# Patient Record
Sex: Male | Born: 1980 | Race: White | Hispanic: Yes | Marital: Single
Health system: Southern US, Community
[De-identification: ages and names within clinical notes are randomized; demographics above are authoritative.]

---

## 2008-10-12 ENCOUNTER — Emergency Department (HOSPITAL_COMMUNITY): Admission: EM | Admit: 2008-10-12 | Discharge: 2008-10-12 | Payer: Self-pay | Admitting: Emergency Medicine

## 2008-10-12 IMAGING — CR DG CERVICAL SPINE COMPLETE 4+V
5 series · 5 of 5 positions shown · non-contrast
Comparison: None

CLINICAL DATA: The the fall.  Neck pain.

CERVICAL SPINE - COMPLETE 4+ VIEW

[w c-spine lat]
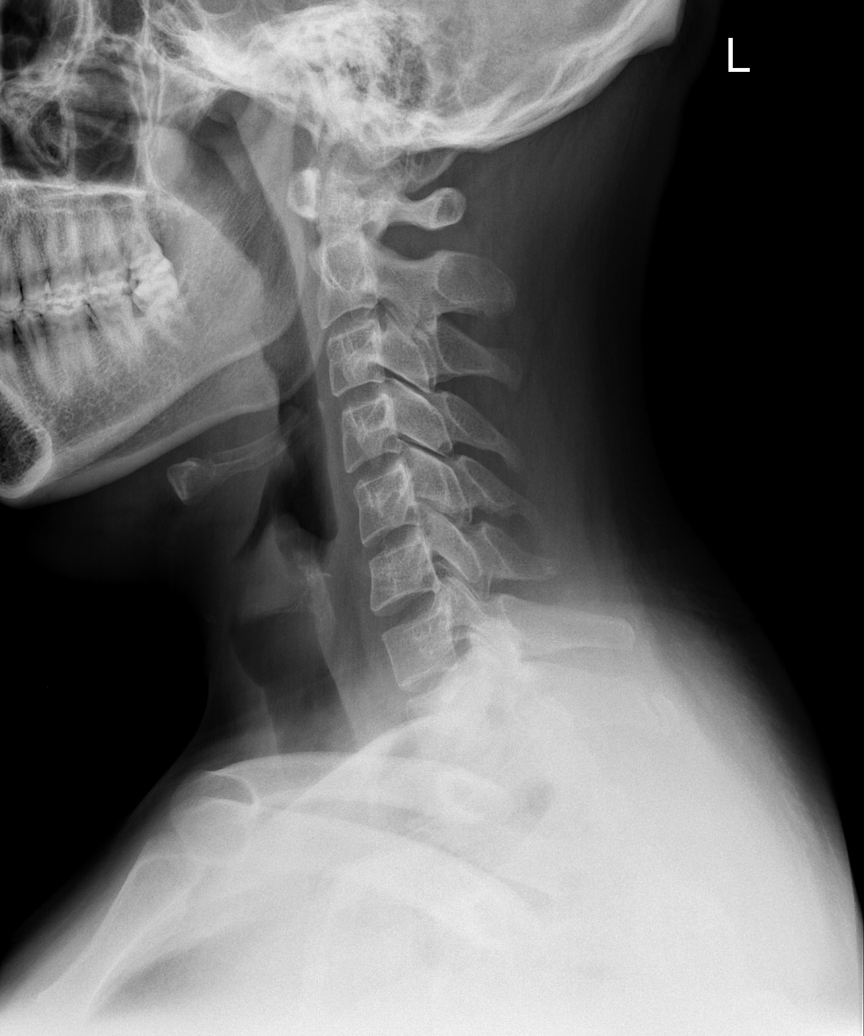

[w c-spine oblique (1 of 2)]
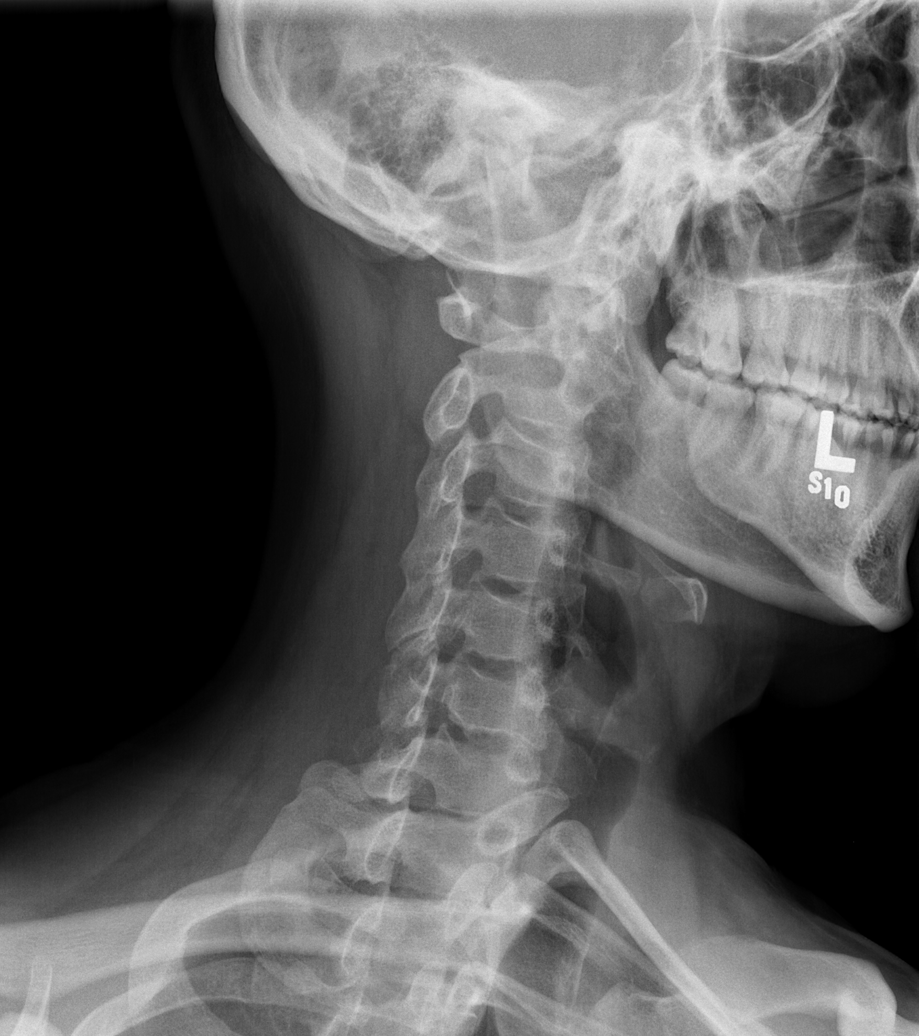

[w c-spine oblique (2 of 2)]
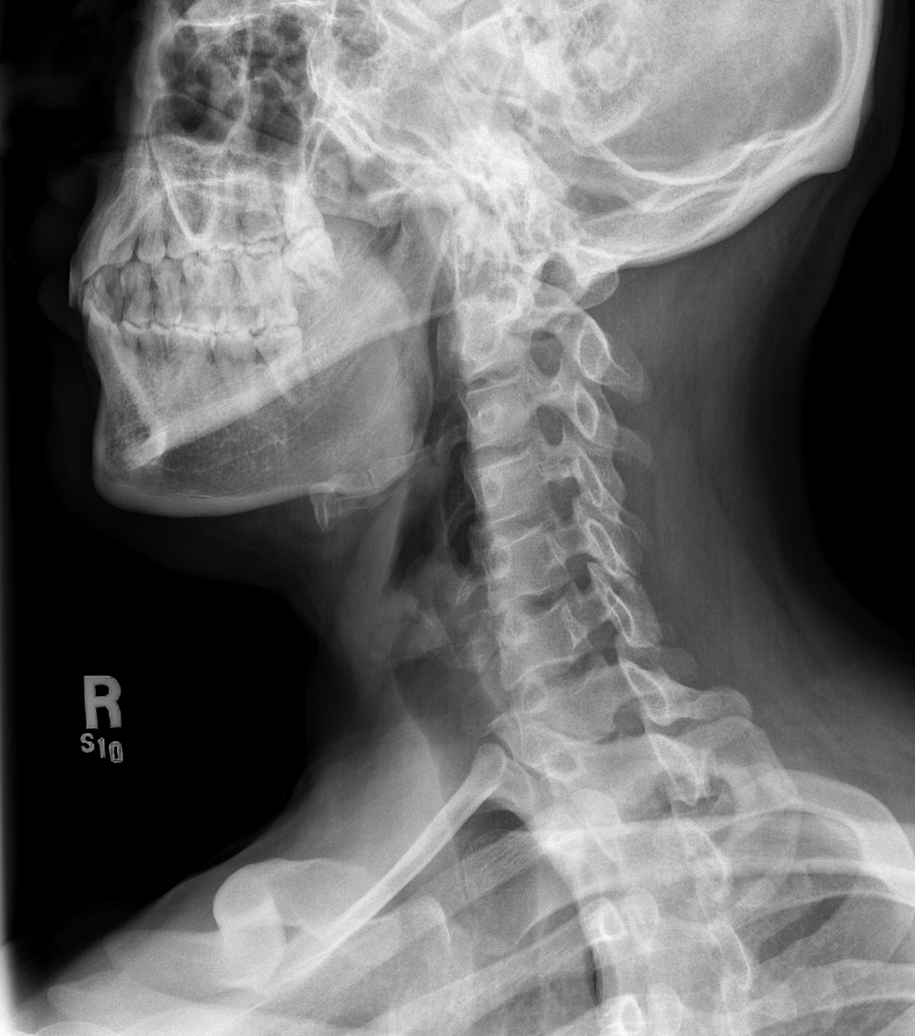

[w c-spine a.p.]
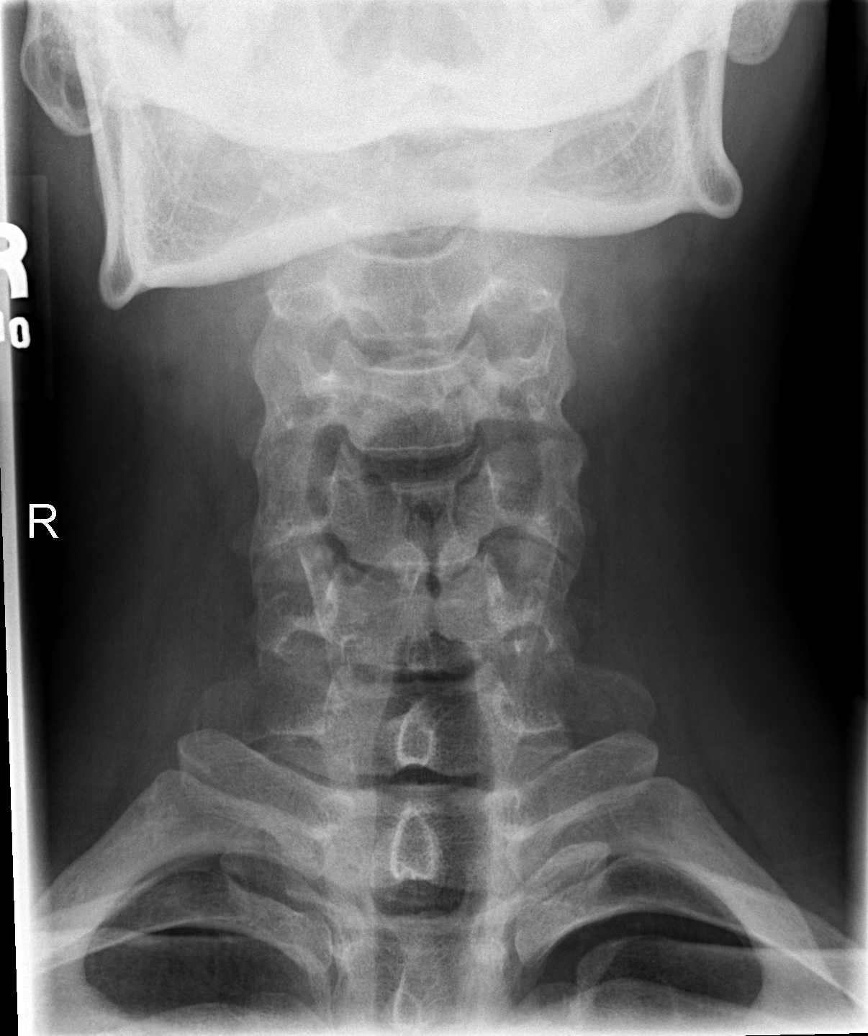

[w c-spine odontoid]
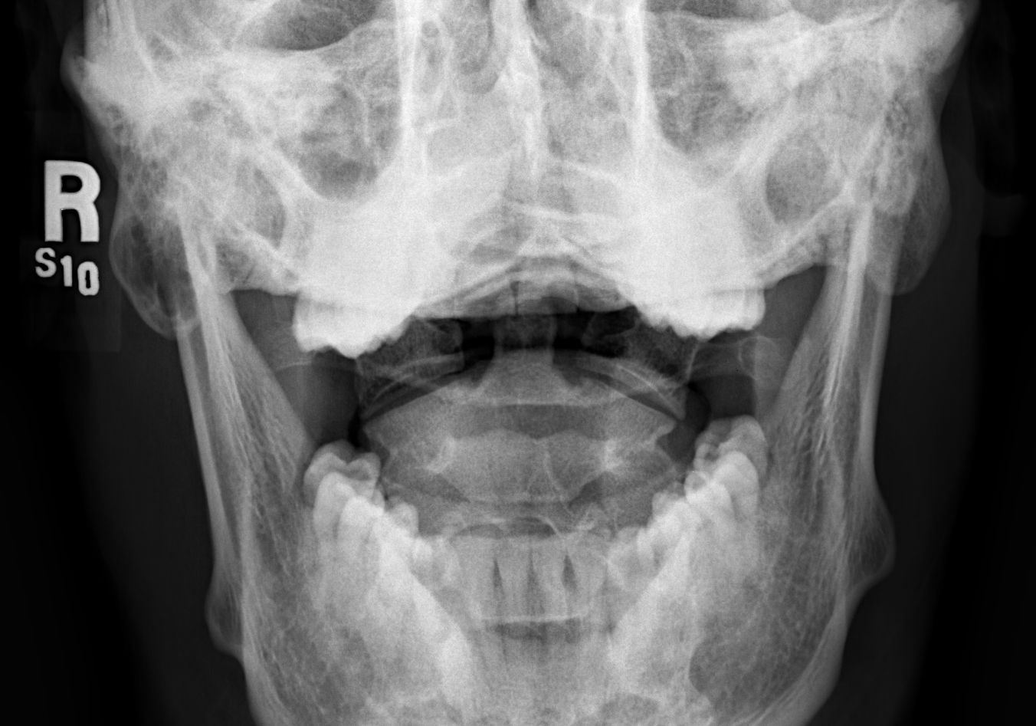

[5 of 5 positions shown; findings below may reference images not displayed]

FINDINGS: Alignment is normal.  No sign of fracture, subluxation,
soft tissue swelling or degenerative change.
IMPRESSION: Normal radiographs

## 2008-10-12 IMAGING — CR DG LUMBAR SPINE COMPLETE 4+V
5 series · 5 of 5 positions shown · non-contrast
Comparison: None

CLINICAL DATA: Fall.  Lower lumbar pain.

LUMBAR SPINE - COMPLETE 4+ VIEW

[t l-spine a.p.]
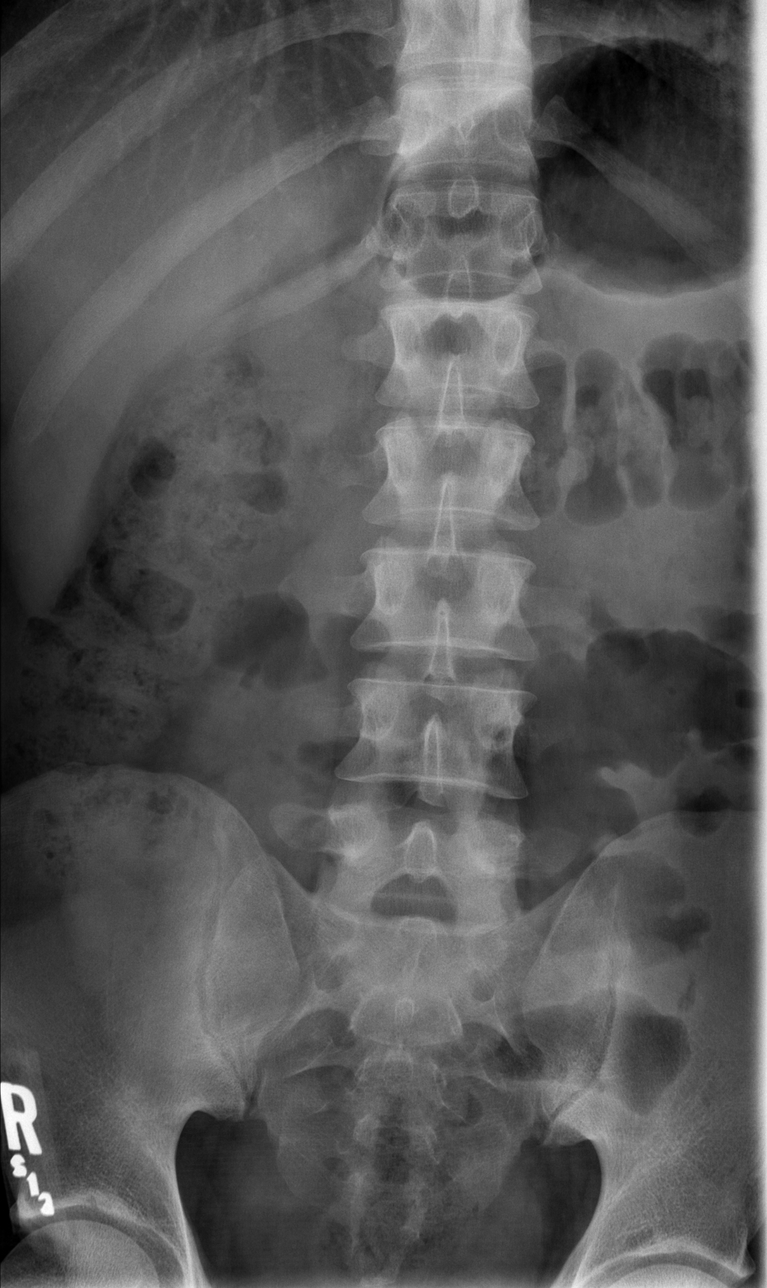

[t l-spine oblique exposure (1 of 2)]
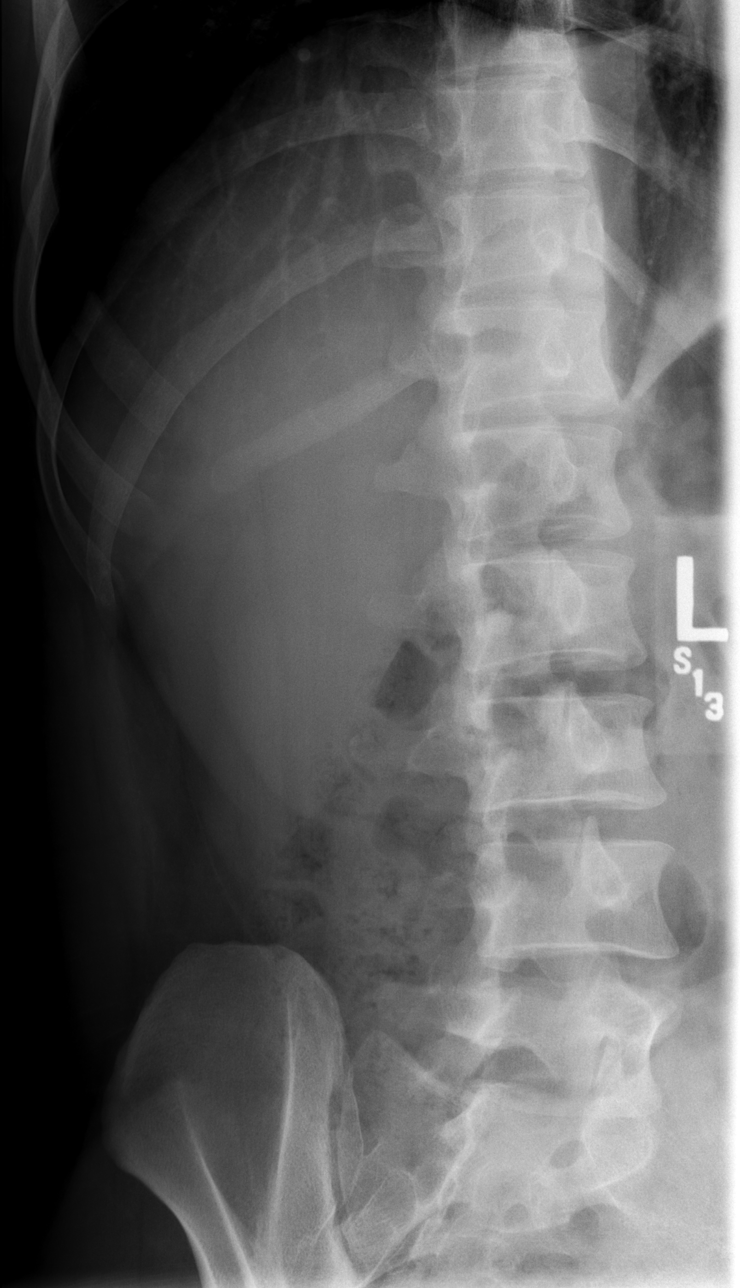

[t l-spine oblique exposure (2 of 2)]
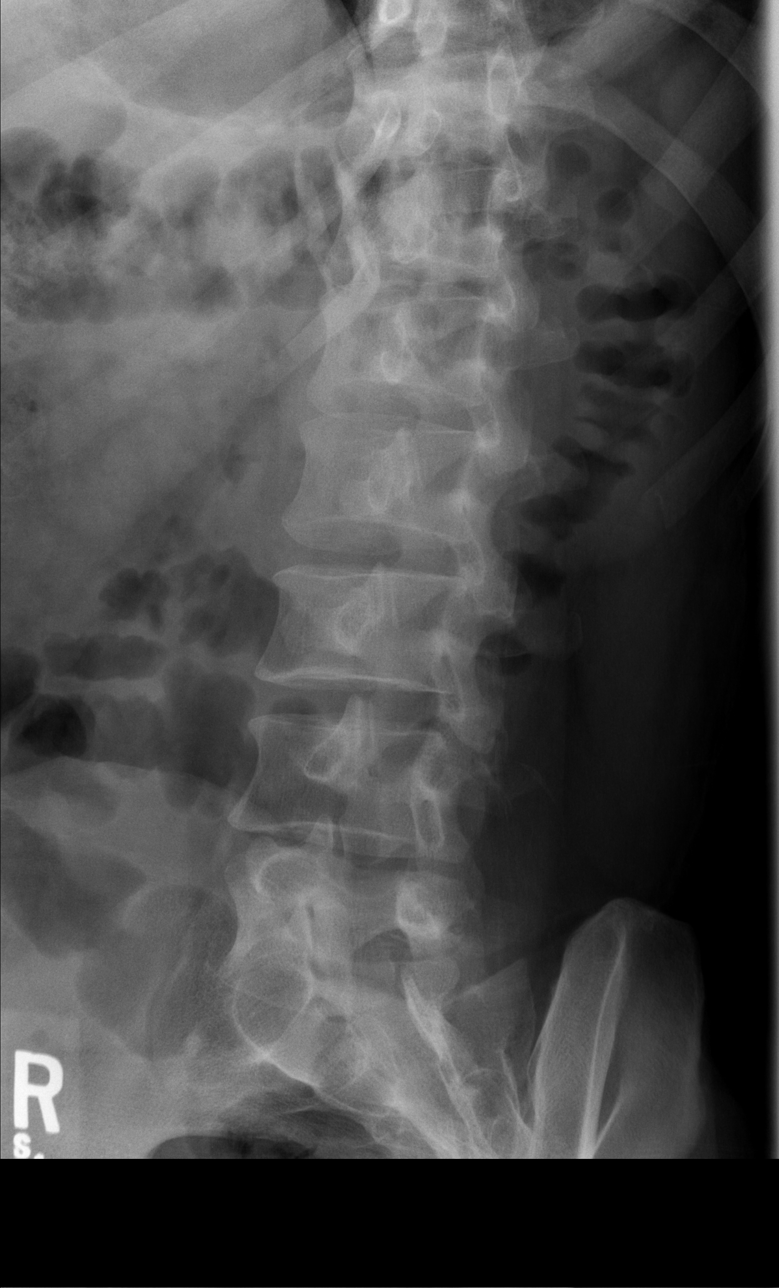

[t l-spine lat]
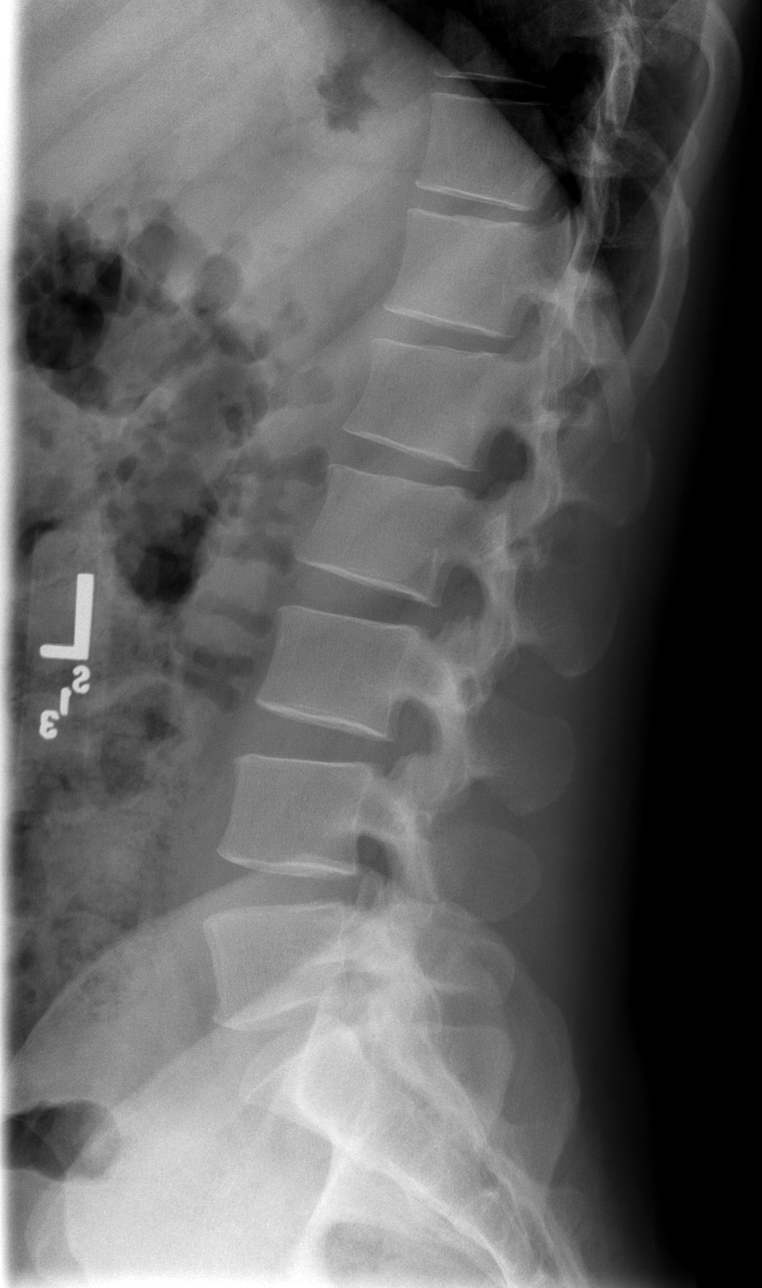

[t l-spine l5-s1 spot]
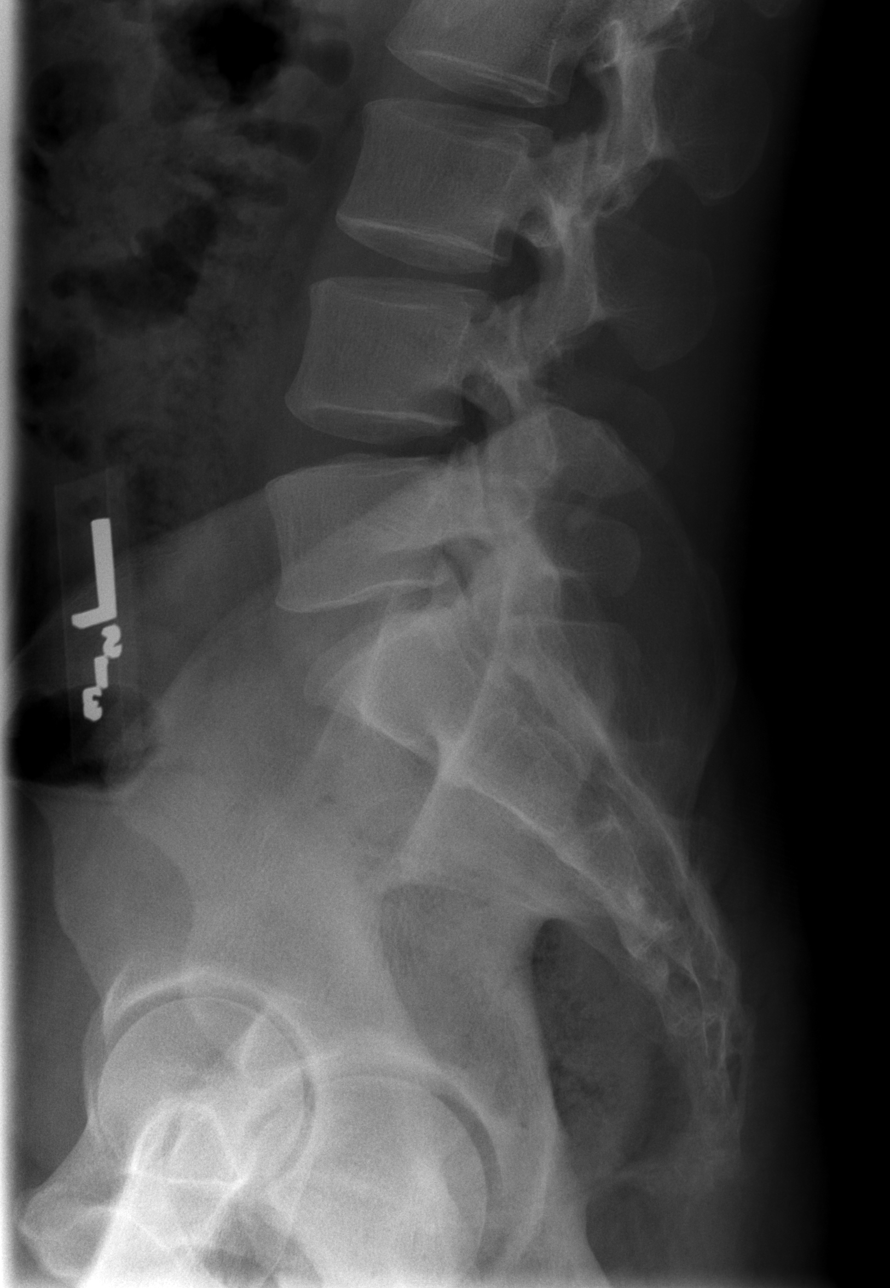

[5 of 5 positions shown; findings below may reference images not displayed]

FINDINGS: Five lumbar-type vertebral bodies show normal alignment.
Disc space heights are normal.  No degenerative changes seen.  No
evidence of fracture or other focal lesion.
IMPRESSION: Negative radiographs

## 2008-10-12 IMAGING — CR DG THORACIC SPINE 2V
3 series · 3 of 3 positions shown · non-contrast
Comparison: None

CLINICAL DATA: Fall.  Back pain, particularly T6-7 region.

THORACIC SPINE - 2 VIEW

[t t-spine a.p.]
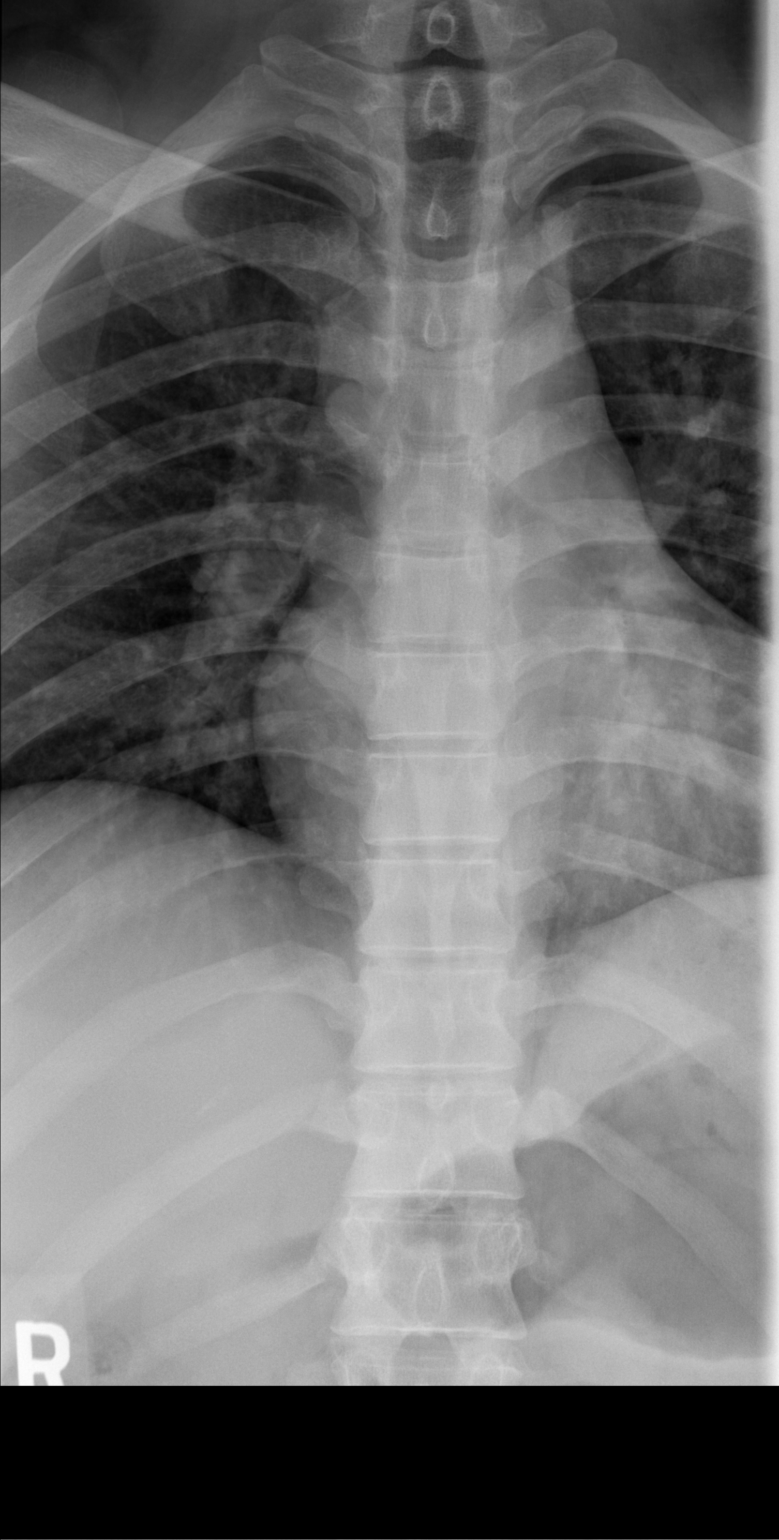

[t t-spine lat *]
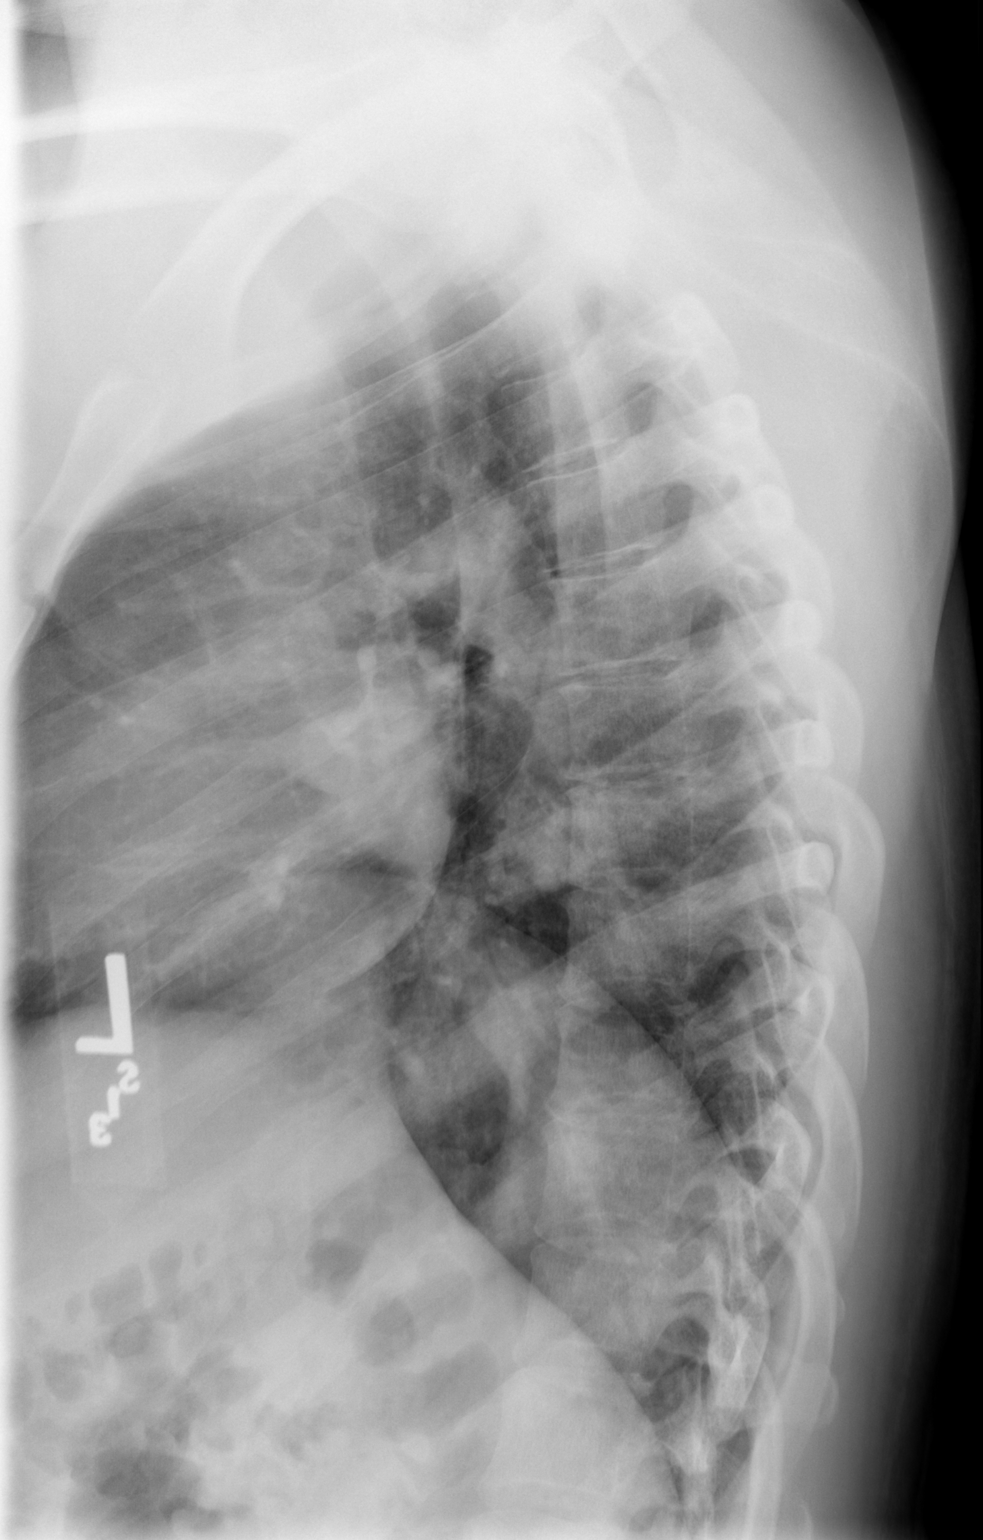

[t swimmers *]
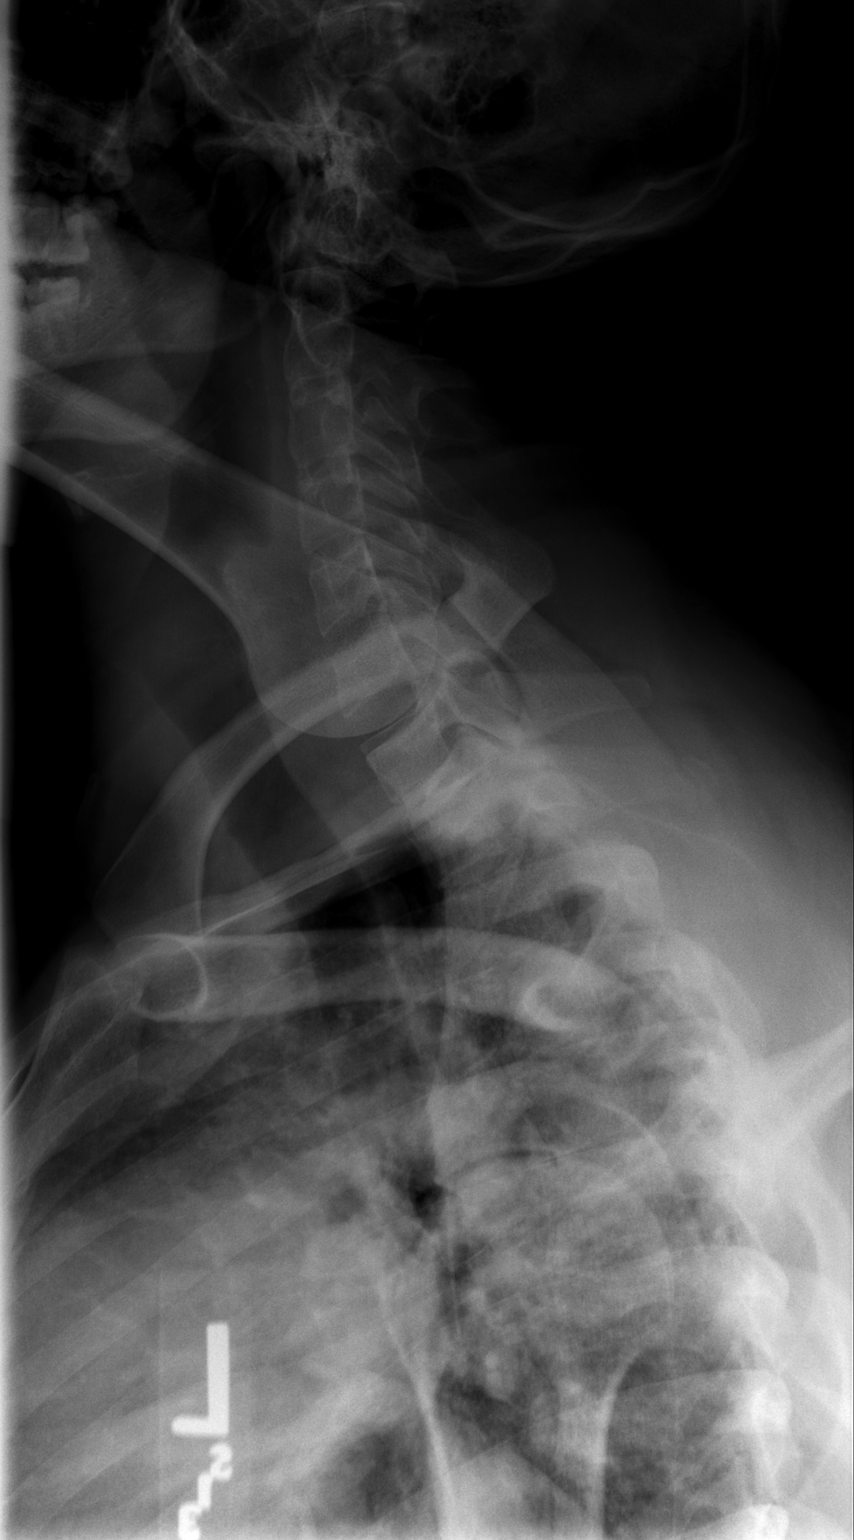

[3 of 3 positions shown; findings below may reference images not displayed]

FINDINGS: Alignment is normal.  No definable fracture.  No
paravertebral swelling.  Posteromedial ribs appear unremarkable.
IMPRESSION: Negative

## 2009-05-17 ENCOUNTER — Emergency Department (HOSPITAL_COMMUNITY): Admission: EM | Admit: 2009-05-17 | Discharge: 2009-05-17 | Payer: Self-pay | Admitting: Emergency Medicine

## 2009-05-17 IMAGING — CR DG LUMBAR SPINE COMPLETE 4+V
3 series · 3 of 3 positions shown · non-contrast
Comparison: 10/12/2008

CLINICAL DATA: Low back pain

LUMBAR SPINE - COMPLETE 4+ VIEW

[t l-spine a.p.]
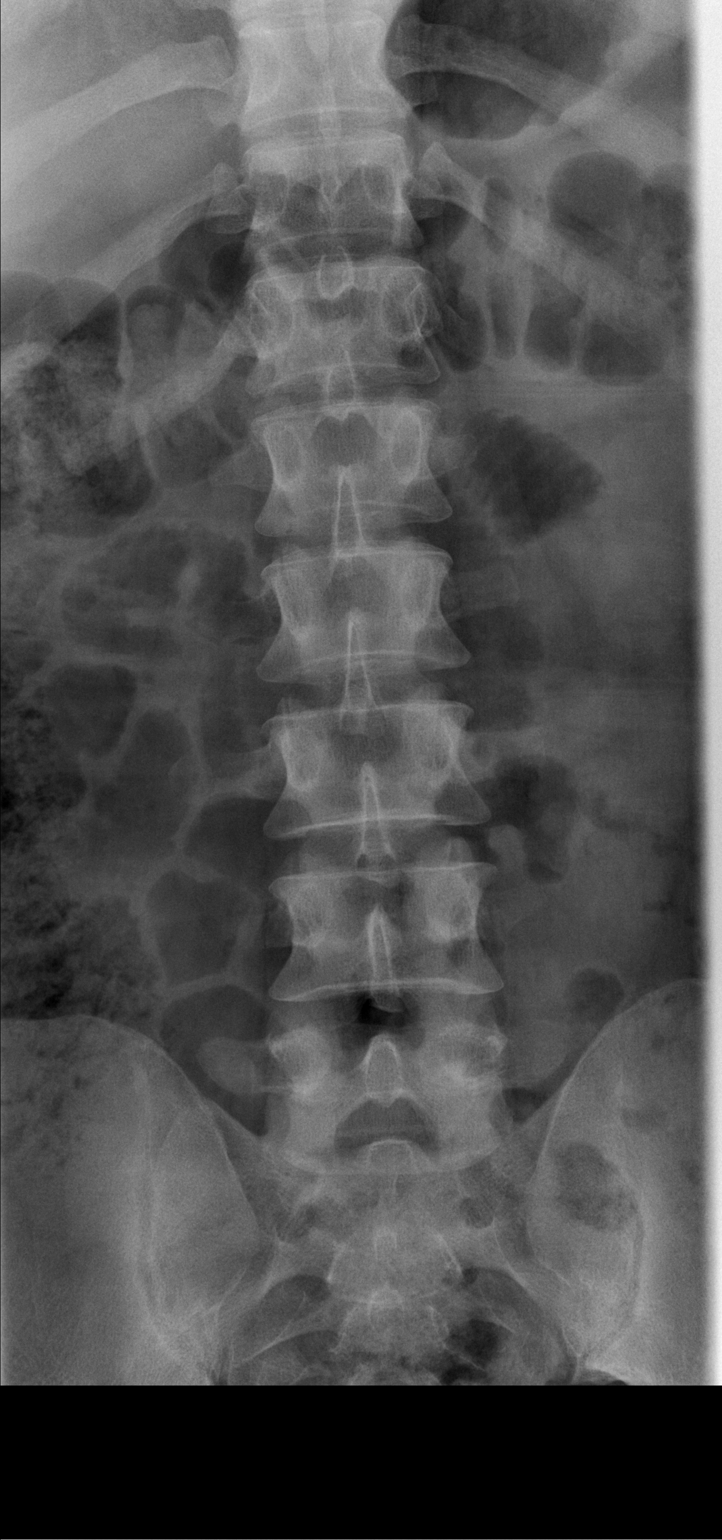

[t l-spine oblique exposure (1 of 2)]
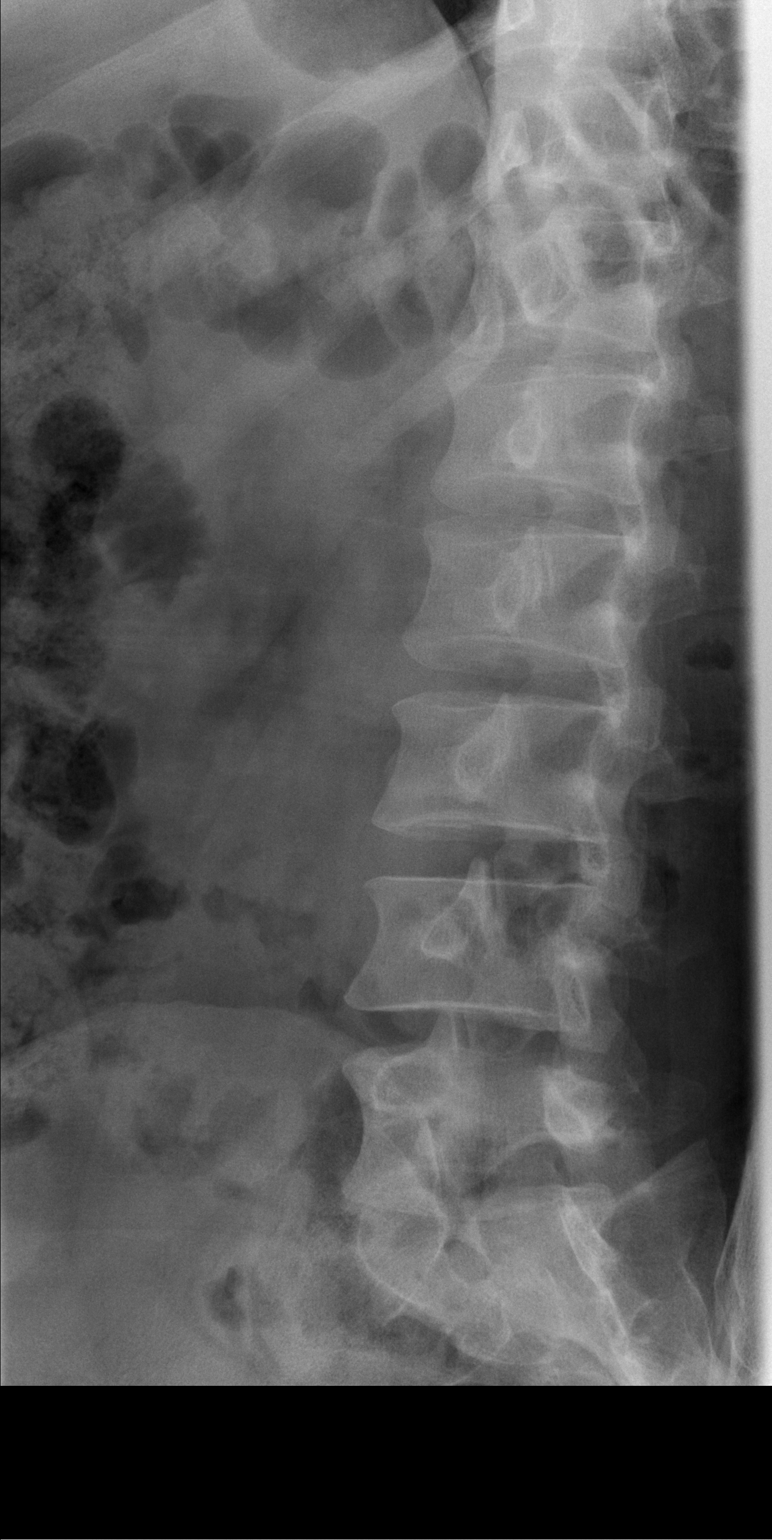

[t l-spine oblique exposure (2 of 2)]
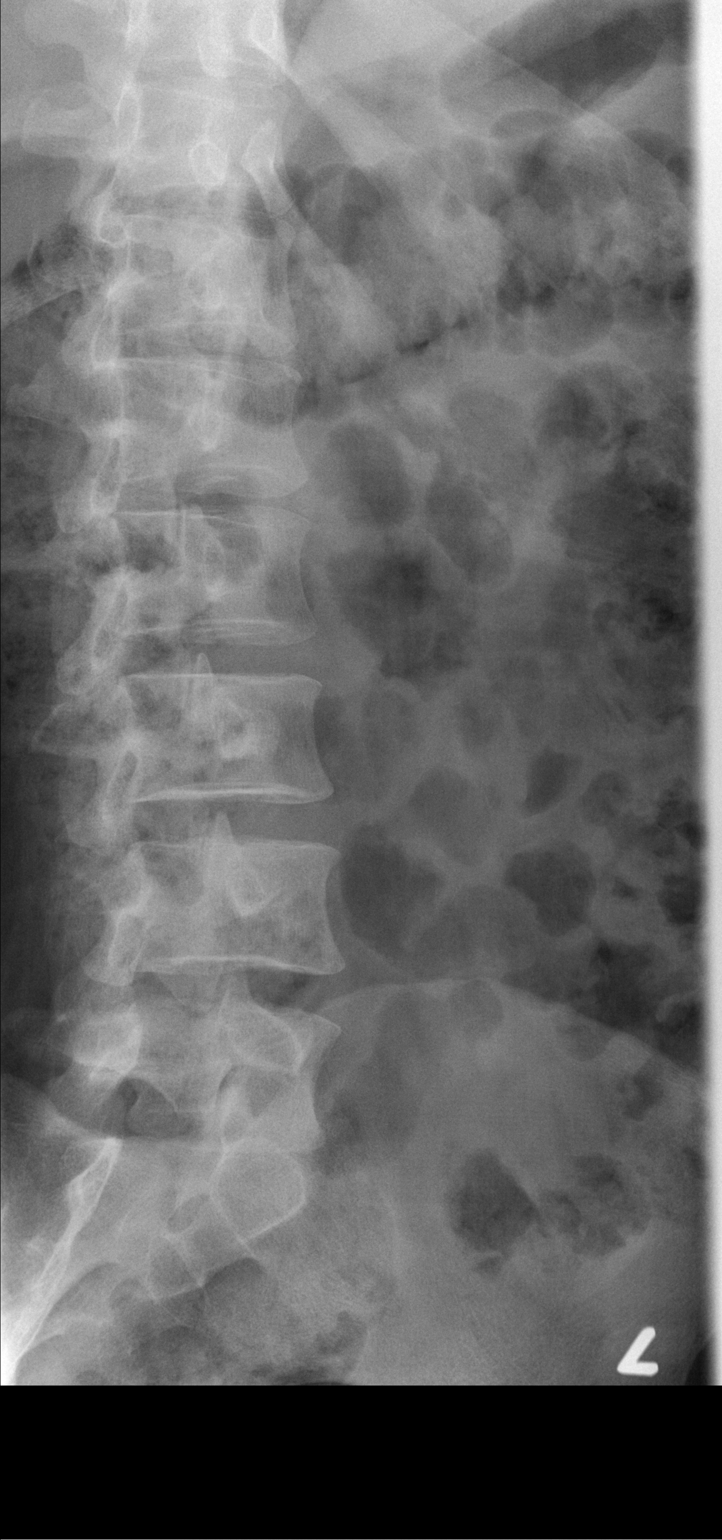

[3 of 3 positions shown; findings below may reference images not displayed]

FINDINGS: No fracture.  No subluxation.  Bony alignment is
anatomic.  The intervertebral disc spaces are preserved throughout.
Facets are well-aligned bilaterally.  SI joints are normal.
IMPRESSION: Normal exam.

## 2009-05-17 IMAGING — CR DG LUMBAR SPINE COMPLETE 4+V
5 series · 5 of 5 positions shown · non-contrast
Comparison: 10/12/2008

CLINICAL DATA: Low back pain

LUMBAR SPINE - COMPLETE 4+ VIEW

[t l-spine a.p.]
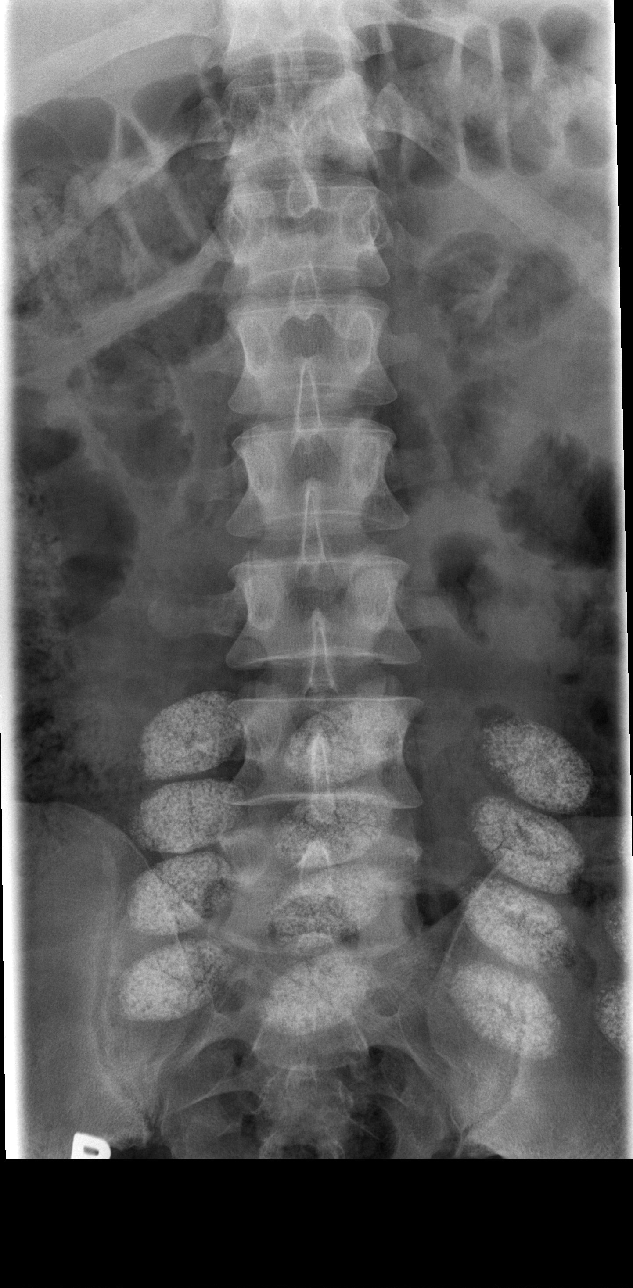

[t l-spine oblique exposure (1 of 2)]
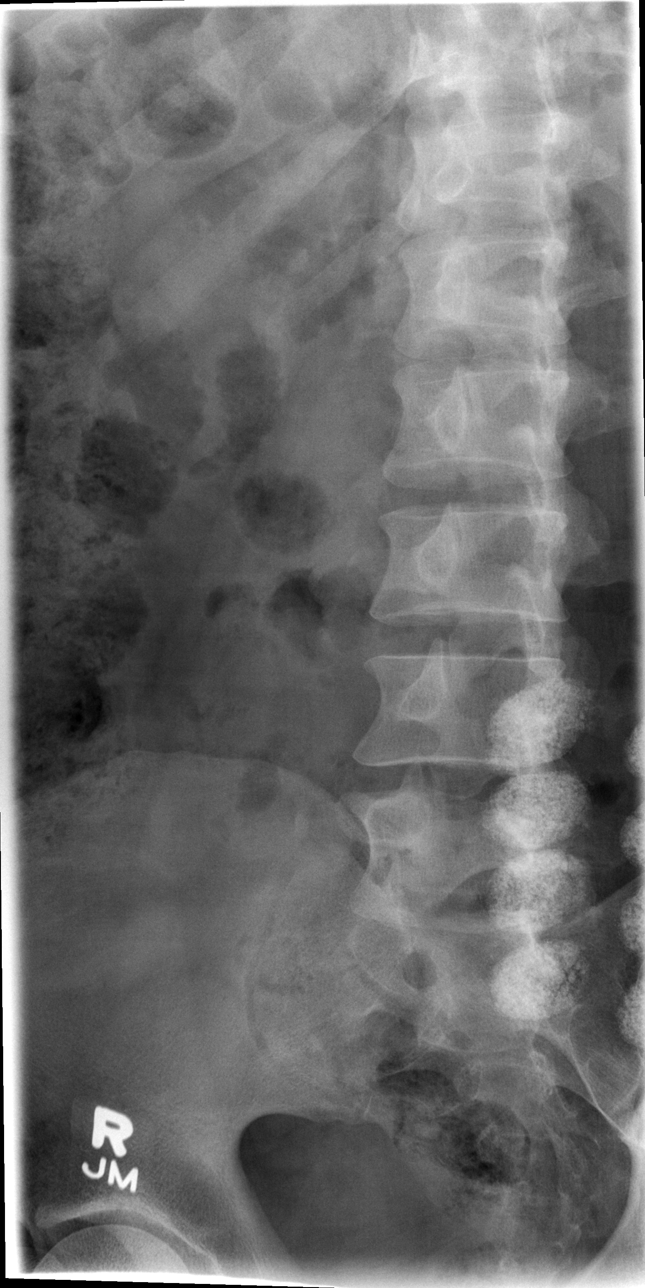

[t l-spine oblique exposure (2 of 2)]
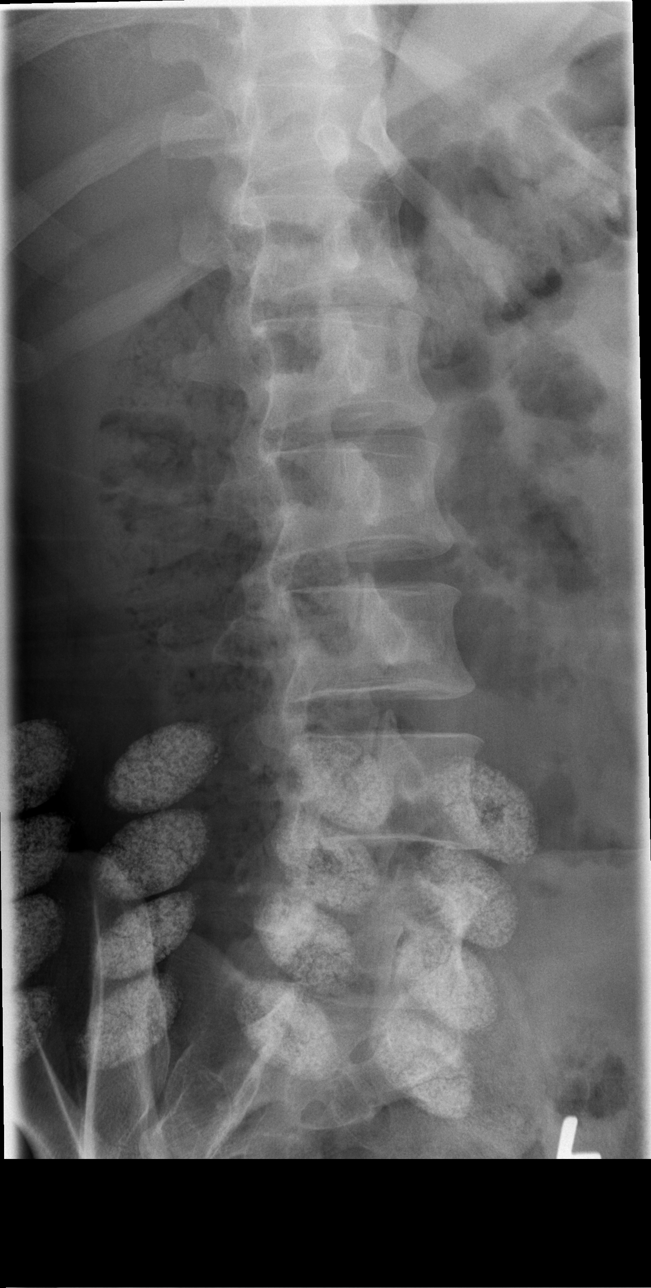

[t l-spine lat]
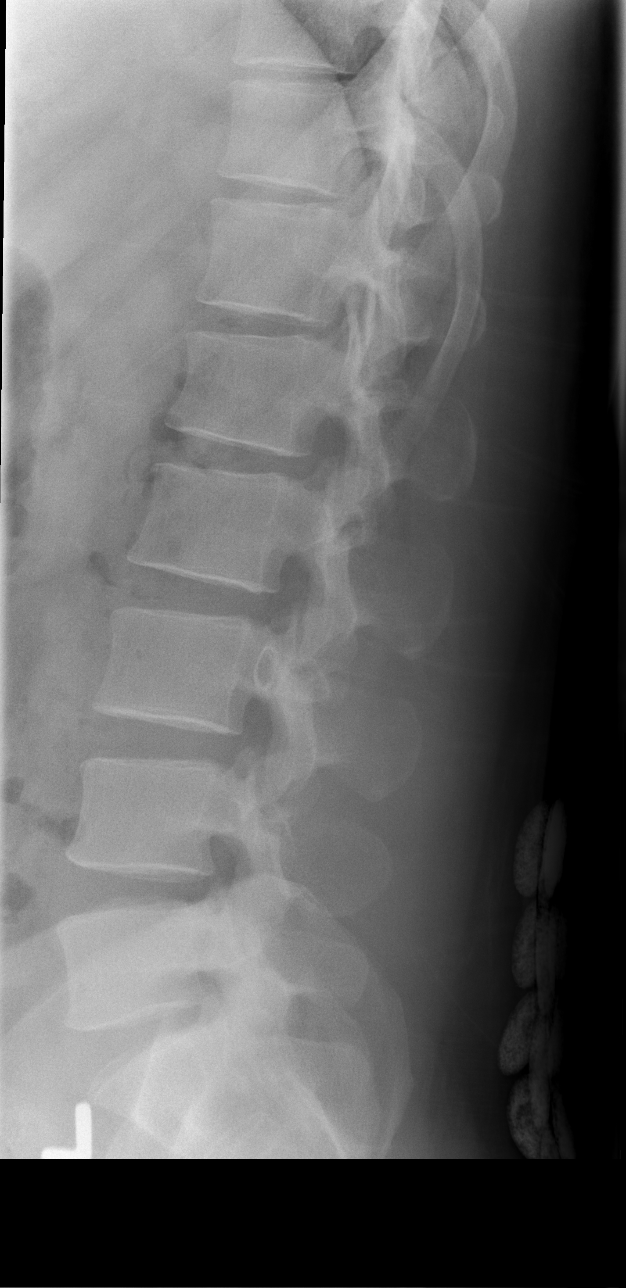

[t l-spine l5-s1 spot]
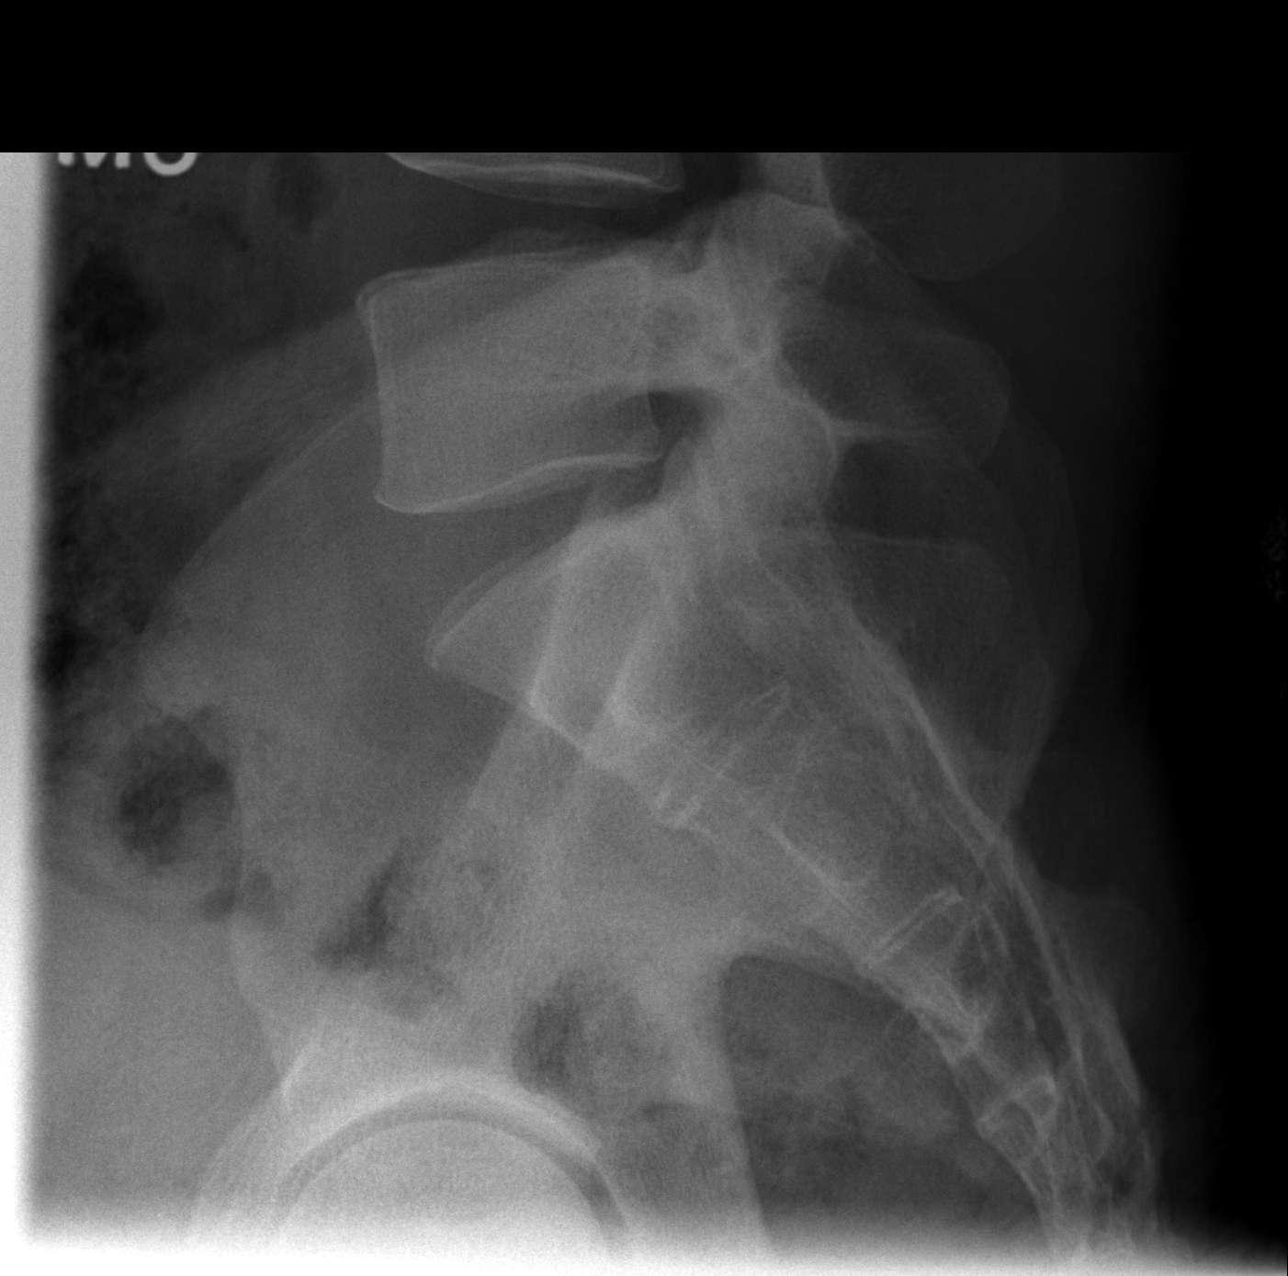

[5 of 5 positions shown; findings below may reference images not displayed]

FINDINGS: No fracture.  No subluxation.  Bony alignment is
anatomic.  The intervertebral disc spaces are preserved throughout.
Facets are well-aligned bilaterally.  SI joints are normal.
IMPRESSION: Normal exam.

## 2009-12-11 ENCOUNTER — Emergency Department (HOSPITAL_COMMUNITY): Admission: EM | Admit: 2009-12-11 | Discharge: 2009-12-12 | Payer: Self-pay | Admitting: Emergency Medicine

## 2009-12-12 IMAGING — CR DG LUMBAR SPINE COMPLETE 4+V
5 series · 5 of 5 positions shown · non-contrast
Comparison: 05/17/2009

CLINICAL DATA: Low back pain radiating to right leg

LUMBAR SPINE - COMPLETE 4+ VIEW

[t l-spine a.p.]
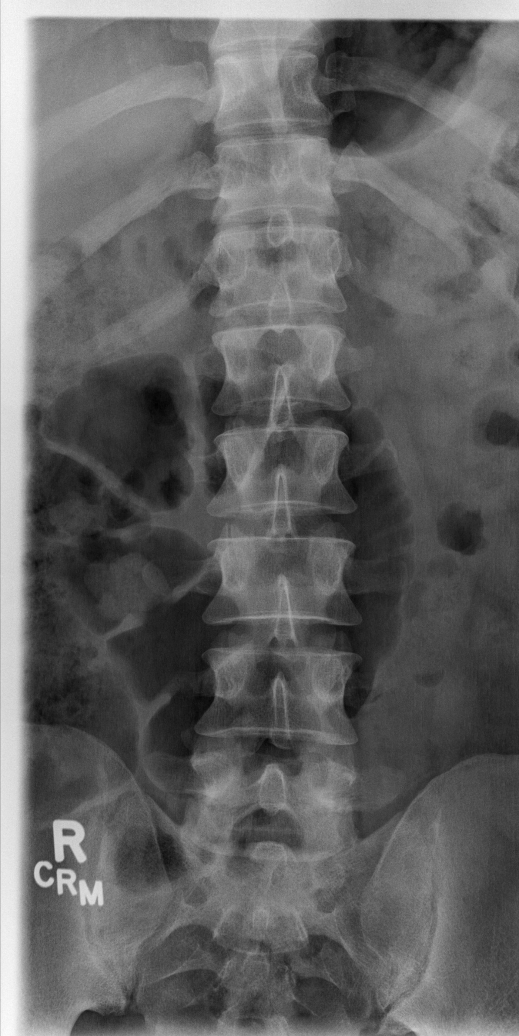

[t l-spine oblique exposure (1 of 2)]
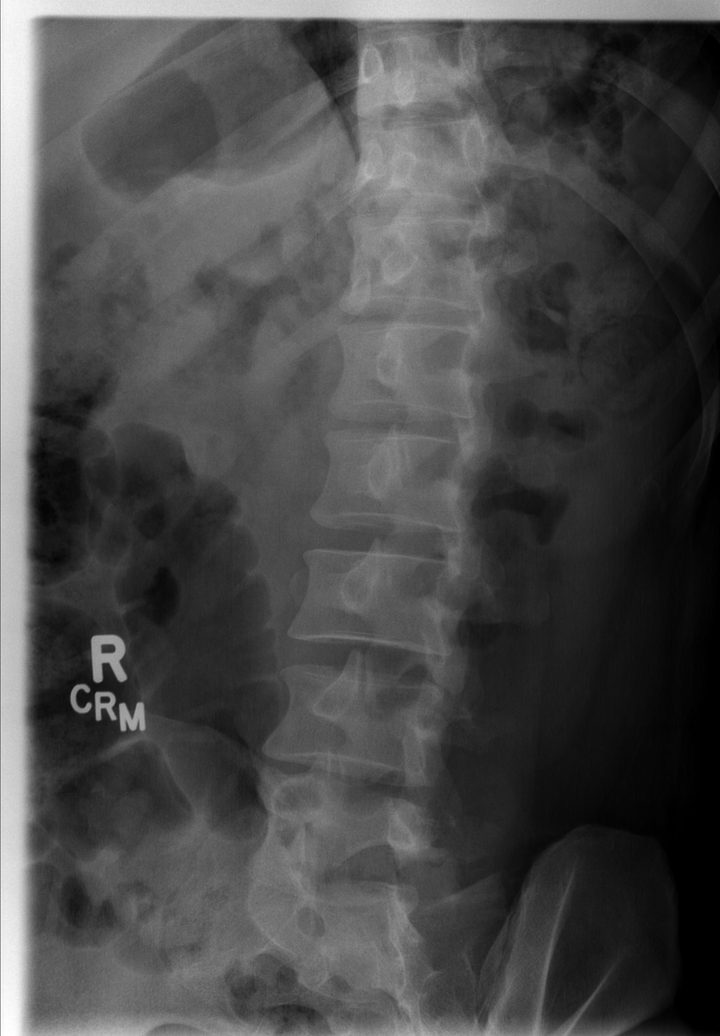

[t l-spine oblique exposure (2 of 2)]
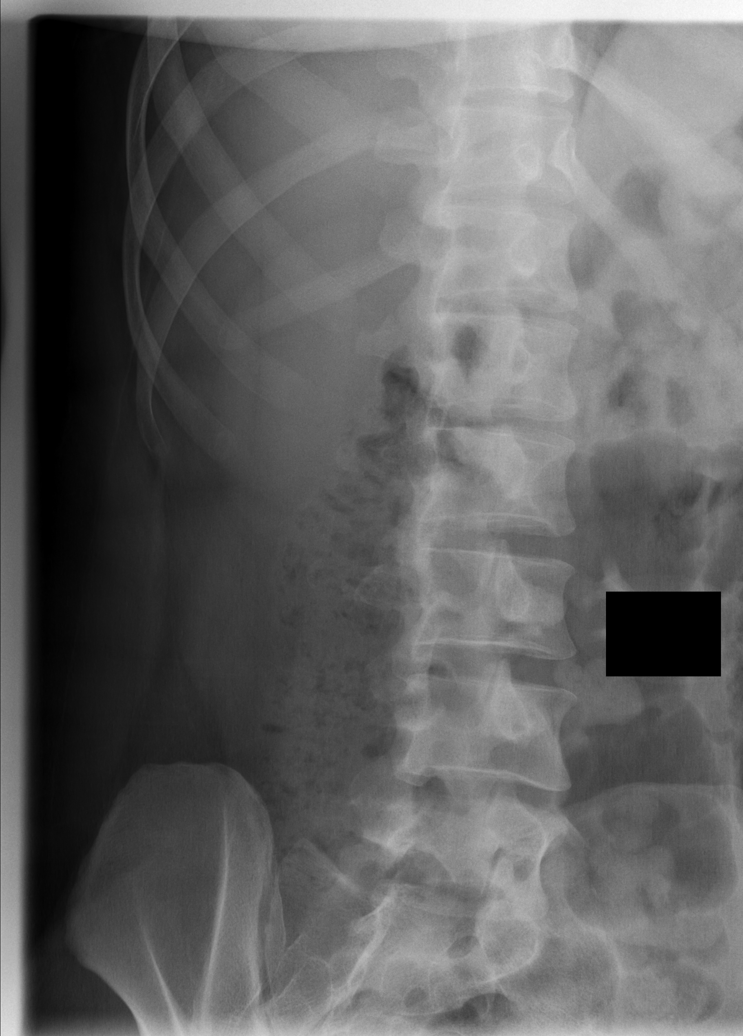

[t l-spine lat]
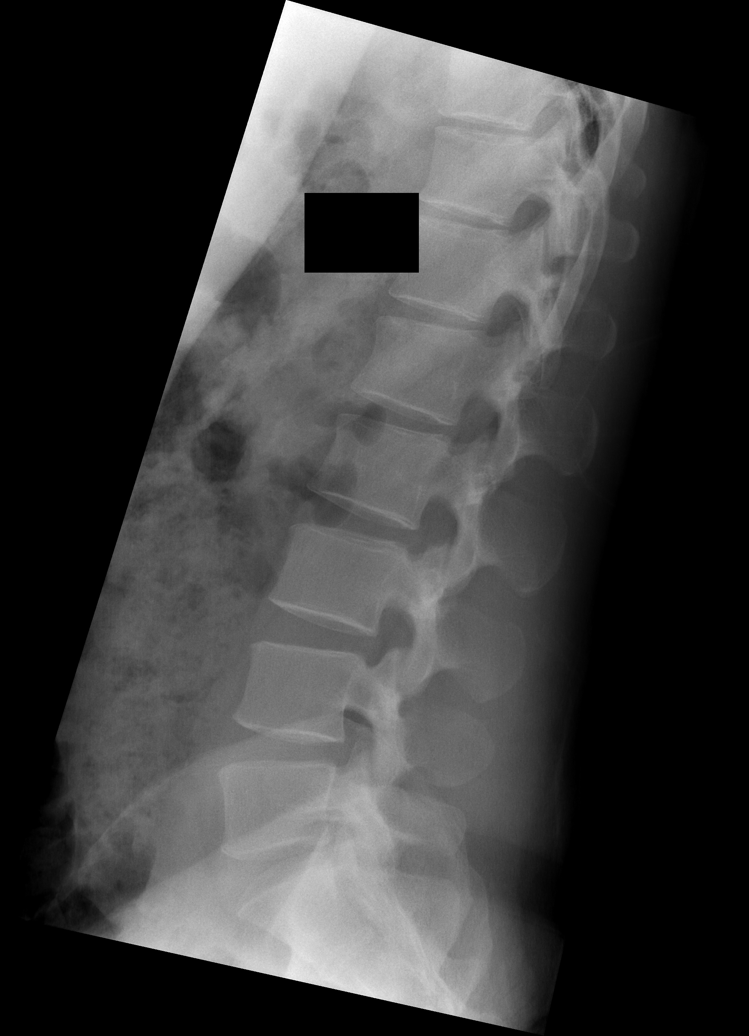

[t l-spine l5-s1 spot *]
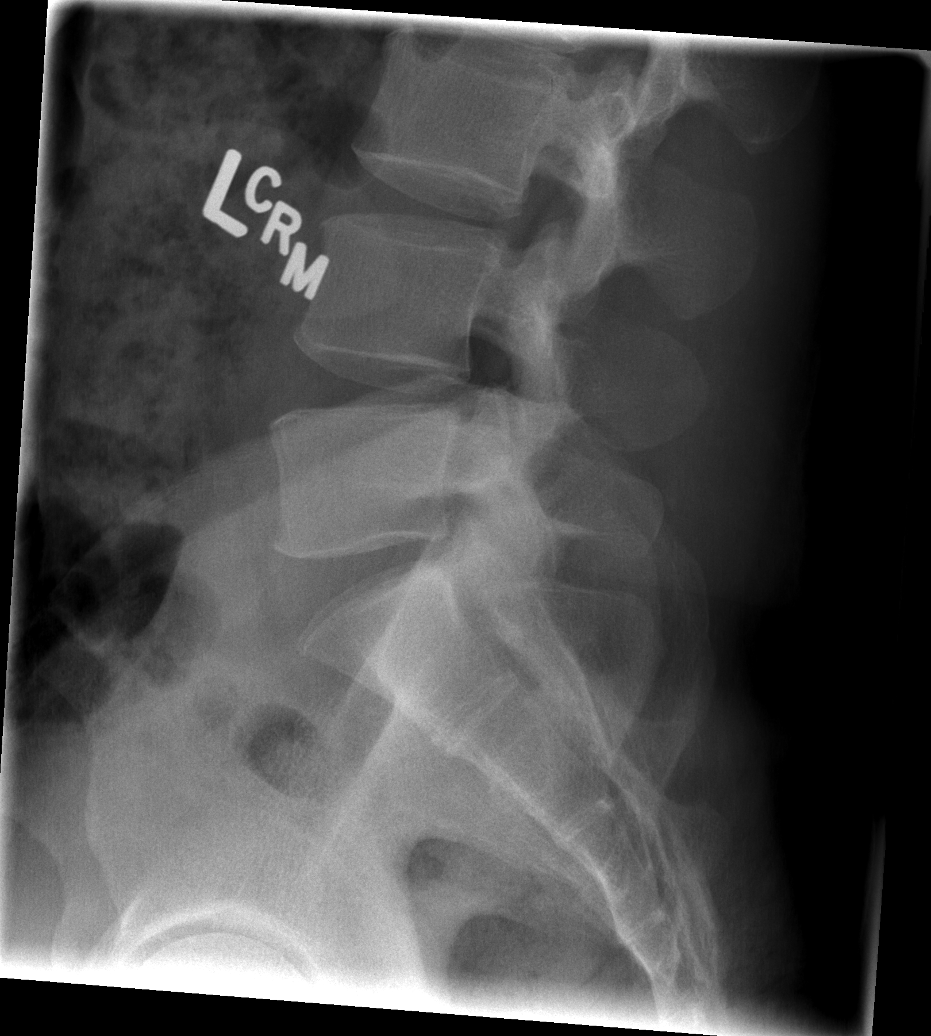

[5 of 5 positions shown; findings below may reference images not displayed]

FINDINGS: Five non-rib bearing lumbar vertebrae.
Vertebral body and disc space heights maintained.
No fracture, subluxation or bone destruction.
No spondylolysis.
SI joints symmetric.
IMPRESSION: Normal exam.

## 2010-05-26 ENCOUNTER — Emergency Department (HOSPITAL_COMMUNITY)
Admission: EM | Admit: 2010-05-26 | Discharge: 2010-05-27 | Disposition: A | Discharge status: Home / Self Care | Payer: Self-pay | Attending: Emergency Medicine | Admitting: Emergency Medicine

## 2010-05-26 DIAGNOSIS — M79609 Pain in unspecified limb: Secondary | ICD-10-CM | POA: Insufficient documentation

## 2010-05-26 DIAGNOSIS — M5430 Sciatica, unspecified side: Secondary | ICD-10-CM | POA: Insufficient documentation

## 2010-05-26 DIAGNOSIS — M545 Low back pain, unspecified: Secondary | ICD-10-CM | POA: Insufficient documentation

## 2010-05-27 ENCOUNTER — Emergency Department (HOSPITAL_COMMUNITY): Payer: Self-pay

## 2010-06-14 LAB — COMPREHENSIVE METABOLIC PANEL
Albumin: 4.1 g/dL (ref 3.5–5.2)
Alkaline Phosphatase: 85 U/L (ref 39–117)
CO2: 28 mEq/L (ref 19–32)
Chloride: 103 mEq/L (ref 96–112)
GFR calc non Af Amer: >60 mL/min (ref >60)
Total Bilirubin: 0.7 mg/dL (ref 0.3–1.2)

## 2010-06-14 LAB — URINALYSIS, ROUTINE W REFLEX MICROSCOPIC
Bilirubin Urine: NEGATIVE
Glucose, UA: NEGATIVE mg/dL
Hgb urine dipstick: NEGATIVE
Ketones, ur: NEGATIVE mg/dL
Nitrite: NEGATIVE
Protein, ur: NEGATIVE mg/dL
Specific Gravity, Urine: 1.025 (ref 1.005–1.030)
Urobilinogen, UA: 0.2 mg/dL (ref 0.0–1.0)
pH: 6 (ref 5.0–8.0)

## 2010-06-14 LAB — DIFFERENTIAL
Basophils Absolute: 0 K/uL (ref 0.0–0.1)
Basophils Relative: 0 % (ref 0–1)
Eosinophils Absolute: 0.1 10*3/uL (ref 0.0–0.7)
Eosinophils Relative: 2 % (ref 0–5)
Lymphocytes Relative: 41 % (ref 12–46)
Lymphs Abs: 2.7 K/uL (ref 0.7–4.0)
Monocytes Absolute: 0.5 10*3/uL (ref 0.1–1.0)
Monocytes Relative: 8 % (ref 3–12)
Neutro Abs: 3.2 10*3/uL (ref 1.7–7.7)
Neutrophils Relative %: 49 % (ref 43–77)

## 2010-06-14 LAB — POCT I-STAT, CHEM 8
BUN: 29 mg/dL — ABNORMAL HIGH (ref 6–23)
Calcium, Ion: 1.13 mmol/L (ref 1.12–1.32)
Chloride: 104 meq/L (ref 96–112)
Creatinine, Ser: 1 mg/dL (ref 0.4–1.5)
Glucose, Bld: 90 mg/dL (ref 70–99)
HCT: 51 % (ref 39.0–52.0)
Hemoglobin: 17.3 g/dL — ABNORMAL HIGH (ref 13.0–17.0)
Potassium: 4.1 meq/L (ref 3.5–5.1)
Sodium: 139 meq/L (ref 135–145)
TCO2: 29 mmol/L (ref 0–100)

## 2010-06-14 LAB — CBC
HCT: 46.3 % (ref 39.0–52.0)
Hemoglobin: 16.4 g/dL (ref 13.0–17.0)
MCH: 32.9 pg (ref 26.0–34.0)
MCHC: 35.4 g/dL (ref 30.0–36.0)
MCV: 93 fL (ref 78.0–100.0)
Platelets: 226 10*3/uL (ref 150–400)
RBC: 4.98 MIL/uL (ref 4.22–5.81)
RDW: 12.8 % (ref 11.5–15.5)
WBC: 6.6 K/uL (ref 4.0–10.5)

## 2010-06-14 LAB — COMPREHENSIVE METABOLIC PANEL WITH GFR
ALT: 30 U/L (ref 0–53)
AST: 22 U/L (ref 0–37)
BUN: 23 mg/dL (ref 6–23)
Calcium: 9.1 mg/dL (ref 8.4–10.5)
Creatinine, Ser: 0.93 mg/dL (ref 0.4–1.5)
GFR calc Af Amer: >60 mL/min (ref >60)
Glucose, Bld: 97 mg/dL (ref 70–99)
Potassium: 4.2 meq/L (ref 3.5–5.1)
Sodium: 137 meq/L (ref 135–145)
Total Protein: 7.2 g/dL (ref 6.0–8.3)

## 2012-07-02 IMAGING — CR DG LUMBAR SPINE COMPLETE 4+V
5 series · 5 of 5 positions shown · non-contrast
Comparison: 12/12/2009.

CLINICAL DATA: 29-year-old male with back pain.

LUMBAR SPINE - COMPLETE 4+ VIEW

[t l-spine a.p.]
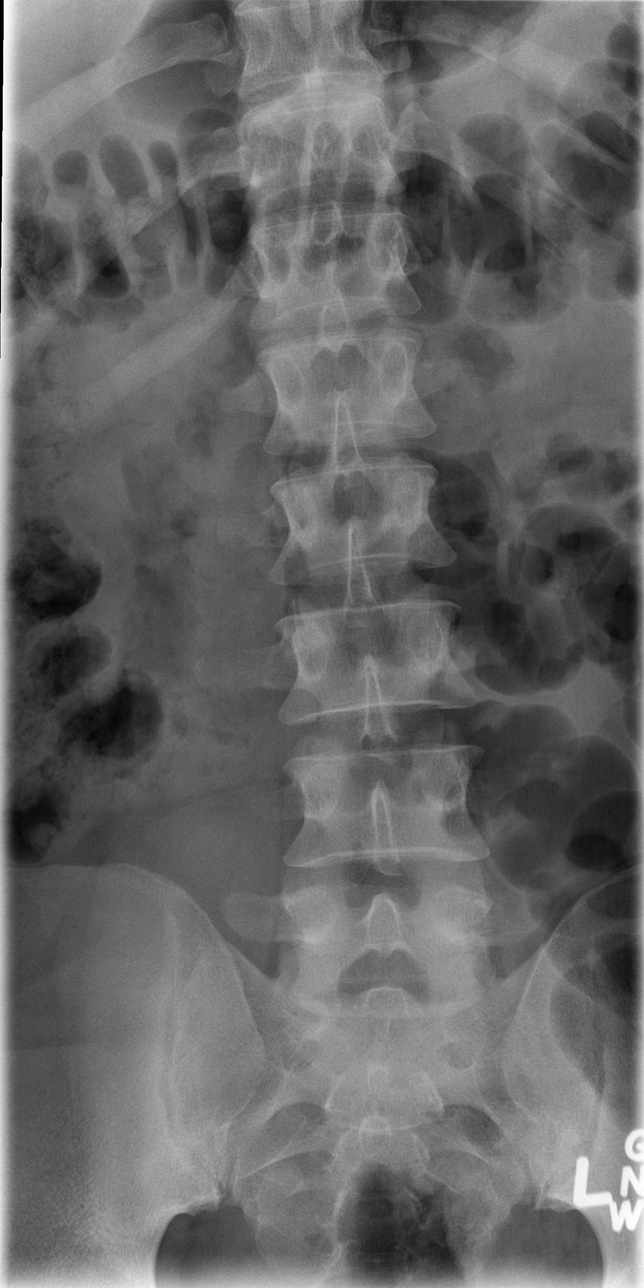

[t l-spine oblique exposure (1 of 2)]
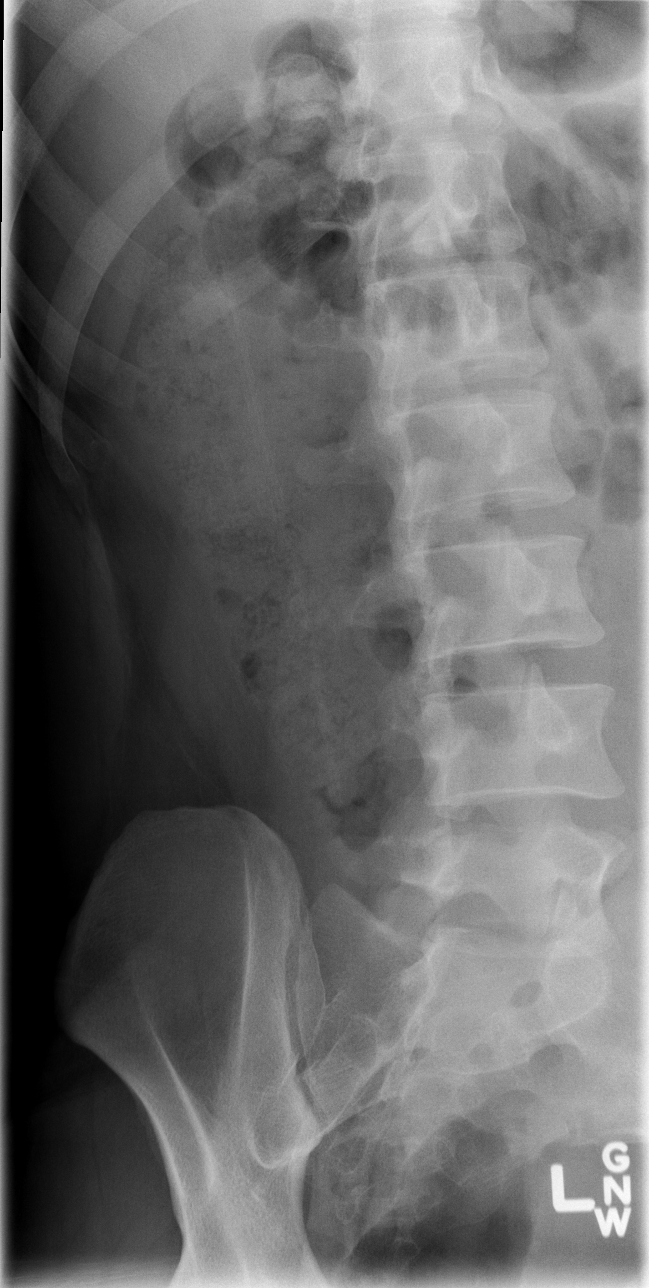

[t l-spine oblique exposure (2 of 2)]
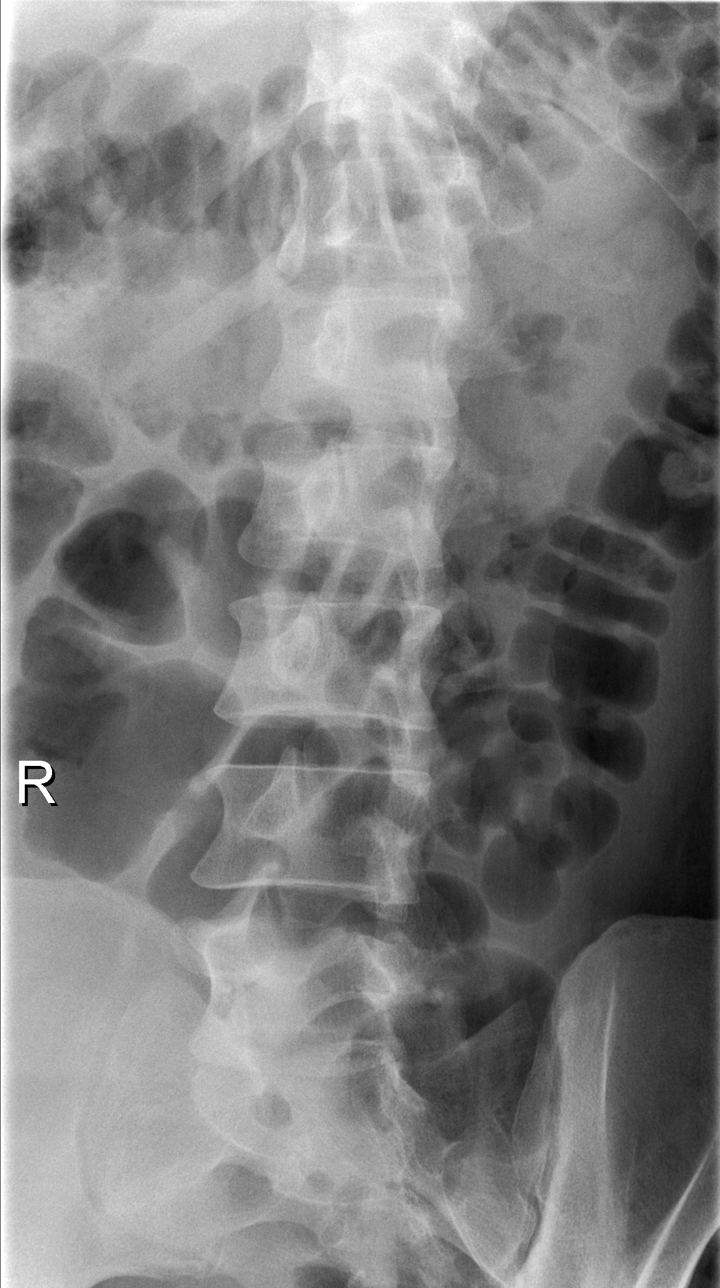

[t l-spine lat]
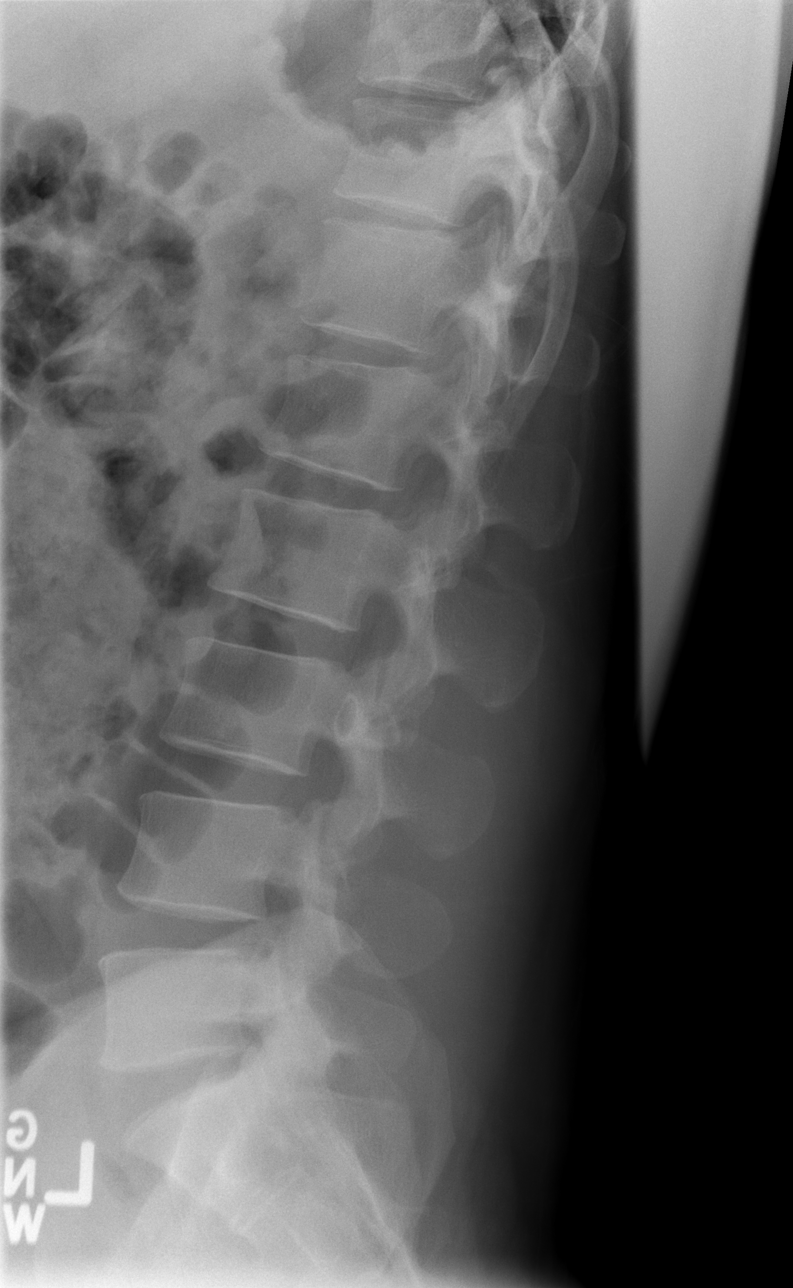

[t l-spine l5-s1 spot]
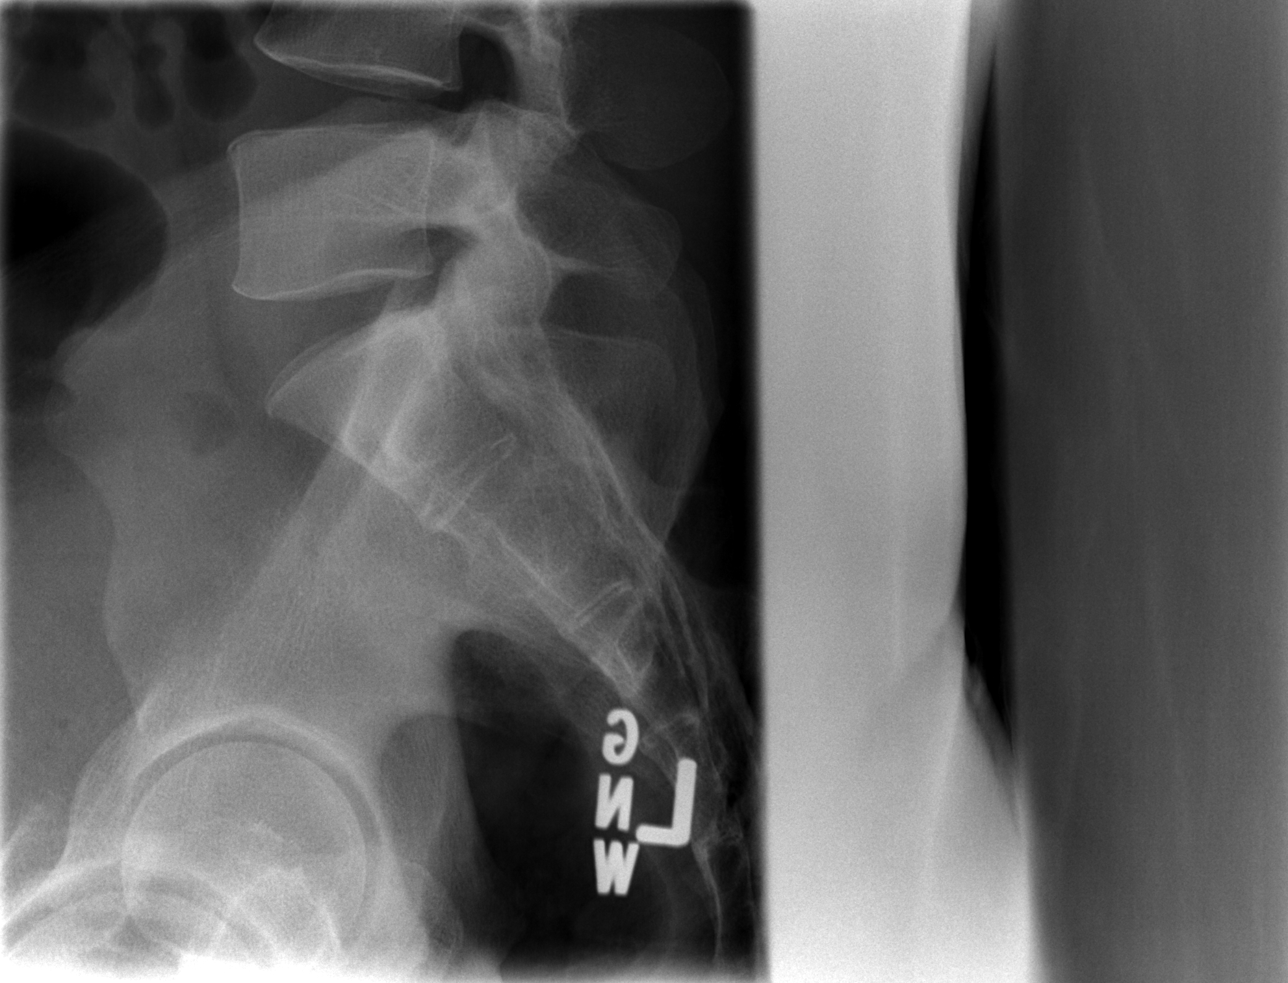

[5 of 5 positions shown; findings below may reference images not displayed]

FINDINGS: Hypoplastic T12 ribs.  Otherwise normal lumbar
segmentation.  Stable, normal vertebral height and alignment.
Relatively preserved disc spaces.  No pars fracture.  Sacrum SI
joints within normal limits.
IMPRESSION: Stable, negative radiographic appearance of the lumbar spine.

## 2022-01-19 ENCOUNTER — Other Ambulatory Visit: Payer: Self-pay

## 2022-01-19 ENCOUNTER — Emergency Department (HOSPITAL_BASED_OUTPATIENT_CLINIC_OR_DEPARTMENT_OTHER): Payer: Self-pay

## 2022-01-19 ENCOUNTER — Emergency Department (HOSPITAL_BASED_OUTPATIENT_CLINIC_OR_DEPARTMENT_OTHER)
Admission: EM | Admit: 2022-01-19 | Discharge: 2022-01-19 | Disposition: A | Discharge status: Home / Self Care | Payer: Self-pay | Attending: Emergency Medicine | Admitting: Emergency Medicine

## 2022-01-19 ENCOUNTER — Encounter (HOSPITAL_BASED_OUTPATIENT_CLINIC_OR_DEPARTMENT_OTHER): Payer: Self-pay | Admitting: Pediatrics

## 2022-01-19 DIAGNOSIS — K6289 Other specified diseases of anus and rectum: Secondary | ICD-10-CM | POA: Insufficient documentation

## 2022-01-19 DIAGNOSIS — R1030 Lower abdominal pain, unspecified: Secondary | ICD-10-CM | POA: Insufficient documentation

## 2022-01-19 DIAGNOSIS — R197 Diarrhea, unspecified: Secondary | ICD-10-CM | POA: Insufficient documentation

## 2022-01-19 LAB — CBC WITH DIFFERENTIAL/PLATELET
Abs Immature Granulocytes: 0.03 10*3/uL (ref 0.00–0.07)
Basophils Absolute: 0.1 10*3/uL (ref 0.0–0.1)
Basophils Relative: 1 %
Eosinophils Absolute: 0.2 10*3/uL (ref 0.0–0.5)
Eosinophils Relative: 2 %
HCT: 47.3 % (ref 39.0–52.0)
Hemoglobin: 16.1 g/dL (ref 13.0–17.0)
Immature Granulocytes: 0 %
Lymphocytes Relative: 28 %
Lymphs Abs: 2.9 10*3/uL (ref 0.7–4.0)
MCH: 31.4 pg (ref 26.0–34.0)
MCHC: 34 g/dL (ref 30.0–36.0)
MCV: 92.2 fL (ref 80.0–100.0)
Monocytes Absolute: 0.7 10*3/uL (ref 0.1–1.0)
Monocytes Relative: 7 %
Neutro Abs: 6.3 10*3/uL (ref 1.7–7.7)
Neutrophils Relative %: 62 %
Platelets: 256 10*3/uL (ref 150–400)
RBC: 5.13 MIL/uL (ref 4.22–5.81)
RDW: 12.6 % (ref 11.5–15.5)
WBC: 10.1 10*3/uL (ref 4.0–10.5)
nRBC: 0 % (ref 0.0–0.2)

## 2022-01-19 LAB — COMPREHENSIVE METABOLIC PANEL
ALT: 55 U/L — ABNORMAL HIGH (ref 0–44)
AST: 26 U/L (ref 15–41)
Albumin: 4.4 g/dL (ref 3.5–5.0)
Alkaline Phosphatase: 106 U/L (ref 38–126)
Anion gap: 7 (ref 5–15)
BUN: 21 mg/dL — ABNORMAL HIGH (ref 6–20)
CO2: 24 mmol/L (ref 22–32)
Calcium: 8.9 mg/dL (ref 8.9–10.3)
Chloride: 106 mmol/L (ref 98–111)
Creatinine, Ser: 0.86 mg/dL (ref 0.61–1.24)
GFR, Estimated: >60 mL/min (ref >60)
Glucose, Bld: 120 mg/dL — ABNORMAL HIGH (ref 70–99)
Potassium: 4.2 mmol/L (ref 3.5–5.1)
Sodium: 137 mmol/L (ref 135–145)
Total Bilirubin: 0.5 mg/dL (ref 0.3–1.2)
Total Protein: 7.9 g/dL (ref 6.5–8.1)

## 2022-01-19 LAB — URINALYSIS, ROUTINE W REFLEX MICROSCOPIC
Bilirubin Urine: NEGATIVE
Glucose, UA: NEGATIVE mg/dL
Hgb urine dipstick: NEGATIVE
Ketones, ur: NEGATIVE mg/dL
Leukocytes,Ua: NEGATIVE
Nitrite: NEGATIVE
Protein, ur: NEGATIVE mg/dL
Specific Gravity, Urine: >=1.03 (ref 1.005–1.030)
pH: 5 (ref 5.0–8.0)

## 2022-01-19 LAB — LIPASE, BLOOD: Lipase: 30 U/L (ref 11–51)

## 2022-01-19 MED ORDER — IOHEXOL 300 MG/ML  SOLN
100.0000 mL | Freq: Once | INTRAMUSCULAR | Status: AC | PRN
  Administered 2022-01-19: 100 mL via INTRAVENOUS

## 2022-01-19 MED ORDER — AMOXICILLIN-POT CLAVULANATE 875-125 MG PO TABS: 1.0000 | ORAL_TABLET | Freq: Two times a day (BID) | ORAL | 0 refills | Status: AC

## 2022-01-19 MED ORDER — HYDROCODONE-ACETAMINOPHEN 5-325 MG PO TABS: 1.0000 | ORAL_TABLET | ORAL | 0 refills | Status: AC | PRN

## 2022-01-19 NOTE — ED Triage Notes (Signed)
Reported started with bilateral scrotum pain after an episode of diarrhea around 2 am; and pain goes down on the legs; denies painful urination; c/e rectal pain as well.

## 2022-01-19 NOTE — ED Notes (Signed)
Patient to US

## 2022-01-19 NOTE — ED Notes (Signed)
Exam in progress. Will update vitals when exam finished.

## 2022-01-19 NOTE — ED Provider Notes (Incomplete)
[facility] EMERGENCY DEPARTMENT Provider Note   CSN: [id number] Arrival date & time: 01/19/22  1655     History {Add pertinent medical, surgical, social history, OB history to HPI:1} Chief Complaint  Patient presents with  . Groin Pain  . Rectal Bleeding    Samuel Nguyen is a 41 y.o. male who presents urgency department chief complaint of rectal pain and abdominal pain.  There is a language barrier and professional translation services are utilized.  Patient states that he woke at 2:00 in the morning with severe lower abdominal pain.  He had several episodes of watery brown diarrhea.  Later in the day he developed severe pain in his rectum and then had pain in both testicles.  He denies any penile discharge, perineal pain.  He has no known medical problems.  He does not see doctors regularly.  He takes no medications.  He states that the pain is inside of his rectum.  He has not had a bowel movement since.  He denies any foreign travel, ingestion of suspicious foods, contacts with similar symptoms.  He has not had any previous abdominal surgeries.   Groin Pain  Rectal Bleeding      Home Medications Prior to Admission medications   Not on File      Allergies    Patient has no known allergies.    Review of Systems   Review of Systems  Gastrointestinal:  Positive for hematochezia.    Physical Exam Updated Vital Signs BP 108/74 (BP Location: Right Arm)   Pulse 68   Temp 97.9 F (36.6 C) (Oral)   Resp 18   Ht 5\' 9"  (1.753 m)   Wt 112 kg   SpO2 97%   BMI 36.48 kg/m  Physical Exam Vitals and nursing note reviewed. Exam conducted with a chaperone present.  Constitutional:      General: He is not in acute distress.    Appearance: He is well-developed. He is not diaphoretic.  HENT:     Head: Normocephalic and atraumatic.  Eyes:     General: No scleral icterus.    Conjunctiva/sclera: Conjunctivae normal.  Cardiovascular:     Rate and Rhythm:  Normal rate and regular rhythm.     Heart sounds: Normal heart sounds.  Pulmonary:     Effort: Pulmonary effort is normal. No respiratory distress.     Breath sounds: Normal breath sounds.  Abdominal:     Palpations: Abdomen is soft.     Tenderness: There is no abdominal tenderness.     Hernia: There is no hernia in the left inguinal area or right inguinal area.  Genitourinary:    Penis: Normal. No paraphimosis, erythema, tenderness, discharge, swelling or lesions.      Testes: Normal. Cremasteric reflex is present.        Right: Mass, tenderness or swelling not present.        Left: Mass, tenderness or swelling not present.     Rectum: Tenderness present. No mass or anal fissure.     Comments: Difficulty tolerating internal rectal examination.  Patient is extremely tender.  No fissures noted.  He feels palpably swollen just inside the anal sphincter.  This is exquisitely tender, no obvious bleeding, no stool on examining glove. Medium is without swelling erythema or lesions. Musculoskeletal:     Cervical back: Normal range of motion and neck supple.  Lymphadenopathy:     Lower Body: No right inguinal adenopathy. Left inguinal adenopathy present.  Skin:  General: Skin is warm and dry.  Neurological:     Mental Status: He is alert.  Psychiatric:        Behavior: Behavior normal.     ED Results / Procedures / Treatments   Labs (all labs ordered are listed, but only abnormal results are displayed) Labs Reviewed  COMPREHENSIVE METABOLIC PANEL - Abnormal; Notable for the following components:      Result Value   Glucose, Bld 120 (*)    BUN 21 (*)    ALT 55 (*)    All other components within normal limits  CBC WITH DIFFERENTIAL/PLATELET  URINALYSIS, ROUTINE W REFLEX MICROSCOPIC  LIPASE, BLOOD    EKG None  Radiology US SCROTUM W/DOPPLER  Result Date: 01/19/2022 CLINICAL DATA:  Acute bilateral scrotal pain. EXAM: SCROTAL ULTRASOUND DOPPLER ULTRASOUND OF THE TESTICLES  TECHNIQUE: Complete ultrasound examination of the testicles, epididymis, and other scrotal structures was performed. Color and spectral Doppler ultrasound were also utilized to evaluate blood flow to the testicles. COMPARISON:  None Available. FINDINGS: Right testicle Measurements: 5.4 x 2.7 x 2.5 cm. No mass or microlithiasis visualized. Left testicle Measurements: 5.2 x 2.9 x 2.3 cm. No mass or microlithiasis visualized. Right epididymis:  Normal in size and appearance. Left epididymis: Normal in size. Simple appearing 7 mm cyst in the epididymal head. Hydrocele:  None visualized. Varicocele:  None visualized. Pulsed Doppler interrogation of both testes demonstrates normal low resistance arterial and venous waveforms bilaterally. IMPRESSION: 1. No evidence of testicular torsion or other acute finding. 2. 7 mm left epididymal head cyst. Electronically Signed   By: Dahlia Bailiff M.D.   On: 01/19/2022 21:00    Procedures Procedures  {Document cardiac monitor, telemetry assessment procedure when appropriate:1}  Medications Ordered in ED Medications - No data to display  ED Course/ Medical Decision Making/ A&P                           Medical Decision Making 41 year old male who presents emergency department with diarrhea .  Amount and/or Complexity of Data Reviewed Labs: ordered. Radiology: ordered.  Risk Prescription drug management.   ***  {Document critical care time when appropriate:1} {Document review of labs and clinical decision tools ie heart score, Chads2Vasc2 etc:1}  {Document your independent review of radiology images, and any outside records:1} {Document your discussion with family members, caretakers, and with consultants:1} {Document social determinants of health affecting pt's care:1} {Document your decision making why or why not admission, treatments were needed:1} Final Clinical Impression(s) / ED Diagnoses Final diagnoses:  None    Rx / DC Orders ED Discharge  Orders     None

## 2022-01-19 NOTE — ED Provider Notes (Signed)
[facility] EMERGENCY DEPARTMENT Provider Note   CSN: [id number] Arrival date & time: 01/19/22  1655     History {Add pertinent medical, surgical, social history, OB history to HPI:1} Chief Complaint  Patient presents with   Groin Pain   Rectal Bleeding    Samuel Nguyen is a 41 y.o. male who presents urgency department chief complaint of rectal pain and abdominal pain.  There is a language barrier and professional translation services are utilized.  Patient states that he woke at 2:00 in the morning with severe lower abdominal pain.  He had several episodes of watery brown diarrhea.  Later in the day he developed severe pain in his rectum and then had pain in both testicles.  He denies any penile discharge, perineal pain.  He has no known medical problems.  He does not see doctors regularly.  He takes no medications.  He states that the pain is inside of his rectum.  He has not had a bowel movement since.  He denies any foreign travel, ingestion of suspicious foods, contacts with similar symptoms.  He has not had any previous abdominal surgeries.   Groin Pain  Rectal Bleeding      Home Medications Prior to Admission medications   Not on File      Allergies    Patient has no known allergies.    Review of Systems   Review of Systems  Gastrointestinal:  Positive for hematochezia.    Physical Exam Updated Vital Signs BP 108/74 (BP Location: Right Arm)   Pulse 68   Temp 97.9 F (36.6 C) (Oral)   Resp 18   Ht 5\' 9"  (1.753 m)   Wt 112 kg   SpO2 97%   BMI 36.48 kg/m  Physical Exam Vitals and nursing note reviewed. Exam conducted with a chaperone present.  Constitutional:      General: He is not in acute distress.    Appearance: He is well-developed. He is not diaphoretic.  HENT:     Head: Normocephalic and atraumatic.  Eyes:     General: No scleral icterus.    Conjunctiva/sclera: Conjunctivae normal.  Cardiovascular:     Rate and Rhythm: Normal  rate and regular rhythm.     Heart sounds: Normal heart sounds.  Pulmonary:     Effort: Pulmonary effort is normal. No respiratory distress.     Breath sounds: Normal breath sounds.  Abdominal:     Palpations: Abdomen is soft.     Tenderness: There is no abdominal tenderness.     Hernia: There is no hernia in the left inguinal area or right inguinal area.  Genitourinary:    Penis: Normal. No paraphimosis, erythema, tenderness, discharge, swelling or lesions.      Testes: Normal. Cremasteric reflex is present.        Right: Mass, tenderness or swelling not present.        Left: Mass, tenderness or swelling not present.     Rectum: Tenderness present. No mass or anal fissure.     Comments: Difficulty tolerating internal rectal examination.  Patient is extremely tender.  No fissures noted.  He feels palpably swollen just inside the anal sphincter.  This is exquisitely tender, no obvious bleeding, no stool on examining glove. Medium is without swelling erythema or lesions. Musculoskeletal:     Cervical back: Normal range of motion and neck supple.  Lymphadenopathy:     Lower Body: No right inguinal adenopathy. Left inguinal adenopathy present.  Skin:  General: Skin is warm and dry.  Neurological:     Mental Status: He is alert.  Psychiatric:        Behavior: Behavior normal.     ED Results / Procedures / Treatments   Labs (all labs ordered are listed, but only abnormal results are displayed) Labs Reviewed  COMPREHENSIVE METABOLIC PANEL - Abnormal; Notable for the following components:      Result Value   Glucose, Bld 120 (*)    BUN 21 (*)    ALT 55 (*)    All other components within normal limits  CBC WITH DIFFERENTIAL/PLATELET  URINALYSIS, ROUTINE W REFLEX MICROSCOPIC  LIPASE, BLOOD    EKG None  Radiology US SCROTUM W/DOPPLER  Result Date: 01/19/2022 CLINICAL DATA:  Acute bilateral scrotal pain. EXAM: SCROTAL ULTRASOUND DOPPLER ULTRASOUND OF THE TESTICLES TECHNIQUE:  Complete ultrasound examination of the testicles, epididymis, and other scrotal structures was performed. Color and spectral Doppler ultrasound were also utilized to evaluate blood flow to the testicles. COMPARISON:  None Available. FINDINGS: Right testicle Measurements: 5.4 x 2.7 x 2.5 cm. No mass or microlithiasis visualized. Left testicle Measurements: 5.2 x 2.9 x 2.3 cm. No mass or microlithiasis visualized. Right epididymis:  Normal in size and appearance. Left epididymis: Normal in size. Simple appearing 7 mm cyst in the epididymal head. Hydrocele:  None visualized. Varicocele:  None visualized. Pulsed Doppler interrogation of both testes demonstrates normal low resistance arterial and venous waveforms bilaterally. IMPRESSION: 1. No evidence of testicular torsion or other acute finding. 2. 7 mm left epididymal head cyst. Electronically Signed   By: Dahlia Bailiff M.D.   On: 01/19/2022 21:00    Procedures Procedures  {Document cardiac monitor, telemetry assessment procedure when appropriate:1}  Medications Ordered in ED Medications - No data to display  ED Course/ Medical Decision Making/ A&P                           Medical Decision Making Amount and/or Complexity of Data Reviewed Labs: ordered. Radiology: ordered.   ***  {Document critical care time when appropriate:1} {Document review of labs and clinical decision tools ie heart score, Chads2Vasc2 etc:1}  {Document your independent review of radiology images, and any outside records:1} {Document your discussion with family members, caretakers, and with consultants:1} {Document social determinants of health affecting pt's care:1} {Document your decision making why or why not admission, treatments were needed:1} Final Clinical Impression(s) / ED Diagnoses Final diagnoses:  None    Rx / DC Orders ED Discharge Orders     None
